# Patient Record
Sex: Female | Born: 1965 | Race: Black or African American | Hispanic: No | Marital: Married | Smoking: Never smoker
Health system: Southern US, Community
[De-identification: ages and names within clinical notes are randomized; demographics above are authoritative.]

## PROBLEM LIST (undated history)

## (undated) DIAGNOSIS — E785 Hyperlipidemia, unspecified: Secondary | ICD-10-CM

## (undated) DIAGNOSIS — I729 Aneurysm of unspecified site: Secondary | ICD-10-CM

## (undated) HISTORY — DX: Hyperlipidemia, unspecified: E78.5

## (undated) HISTORY — PX: BREAST EXCISIONAL BIOPSY: SUR124

---

## 1998-05-14 ENCOUNTER — Inpatient Hospital Stay (HOSPITAL_COMMUNITY): Admission: AD | Admit: 1998-05-14 | Discharge: 1998-05-14 | Payer: Self-pay | Admitting: *Deleted

## 1999-03-30 ENCOUNTER — Encounter: Payer: Self-pay | Admitting: Obstetrics and Gynecology

## 1999-03-30 ENCOUNTER — Inpatient Hospital Stay (HOSPITAL_COMMUNITY): Admission: AD | Admit: 1999-03-30 | Discharge: 1999-04-03 | Payer: Self-pay | Admitting: Obstetrics and Gynecology

## 1999-04-01 ENCOUNTER — Encounter: Payer: Self-pay | Admitting: Obstetrics and Gynecology

## 1999-04-03 ENCOUNTER — Encounter: Payer: Self-pay | Admitting: Obstetrics and Gynecology

## 1999-04-06 ENCOUNTER — Inpatient Hospital Stay (HOSPITAL_COMMUNITY): Admission: AD | Admit: 1999-04-06 | Discharge: 1999-04-08 | Payer: Self-pay | Admitting: Obstetrics and Gynecology

## 1999-04-06 ENCOUNTER — Encounter (INDEPENDENT_AMBULATORY_CARE_PROVIDER_SITE_OTHER): Payer: Self-pay | Admitting: Specialist

## 1999-04-15 ENCOUNTER — Encounter: Admission: RE | Admit: 1999-04-15 | Discharge: 1999-07-14 | Payer: Self-pay | Admitting: Obstetrics and Gynecology

## 2000-12-14 ENCOUNTER — Other Ambulatory Visit: Admission: RE | Admit: 2000-12-14 | Discharge: 2000-12-14 | Payer: Self-pay | Admitting: Obstetrics and Gynecology

## 2002-02-11 ENCOUNTER — Other Ambulatory Visit: Admission: RE | Admit: 2002-02-11 | Discharge: 2002-02-11 | Payer: Self-pay | Admitting: Obstetrics and Gynecology

## 2002-04-07 ENCOUNTER — Encounter: Payer: Self-pay | Admitting: Obstetrics and Gynecology

## 2002-04-07 ENCOUNTER — Ambulatory Visit (HOSPITAL_COMMUNITY): Admission: RE | Admit: 2002-04-07 | Discharge: 2002-04-07 | Payer: Self-pay | Admitting: Obstetrics and Gynecology

## 2002-08-24 ENCOUNTER — Inpatient Hospital Stay (HOSPITAL_COMMUNITY): Admission: AD | Admit: 2002-08-24 | Discharge: 2002-08-26 | Payer: Self-pay | Admitting: Obstetrics and Gynecology

## 2004-01-09 ENCOUNTER — Other Ambulatory Visit: Admission: RE | Admit: 2004-01-09 | Discharge: 2004-01-09 | Payer: Self-pay | Admitting: Obstetrics and Gynecology

## 2005-06-09 ENCOUNTER — Other Ambulatory Visit: Admission: RE | Admit: 2005-06-09 | Discharge: 2005-06-09 | Payer: Self-pay | Admitting: Obstetrics and Gynecology

## 2006-01-16 ENCOUNTER — Ambulatory Visit: Payer: Self-pay | Admitting: Family Medicine

## 2006-04-06 ENCOUNTER — Ambulatory Visit: Payer: Self-pay | Admitting: Family Medicine

## 2007-02-18 ENCOUNTER — Encounter: Admission: RE | Admit: 2007-02-18 | Discharge: 2007-02-18 | Payer: Self-pay | Admitting: Obstetrics and Gynecology

## 2007-08-18 ENCOUNTER — Ambulatory Visit: Payer: Self-pay | Admitting: Family Medicine

## 2008-02-21 ENCOUNTER — Encounter: Admission: RE | Admit: 2008-02-21 | Discharge: 2008-02-21 | Payer: Self-pay | Admitting: Obstetrics and Gynecology

## 2008-11-03 ENCOUNTER — Inpatient Hospital Stay (HOSPITAL_COMMUNITY): Admission: AD | Admit: 2008-11-03 | Discharge: 2008-11-03 | Payer: Self-pay | Admitting: Obstetrics and Gynecology

## 2009-05-13 ENCOUNTER — Inpatient Hospital Stay (HOSPITAL_COMMUNITY): Admission: AD | Admit: 2009-05-13 | Discharge: 2009-05-15 | Payer: Self-pay | Admitting: Obstetrics and Gynecology

## 2009-05-14 ENCOUNTER — Encounter (INDEPENDENT_AMBULATORY_CARE_PROVIDER_SITE_OTHER): Payer: Self-pay | Admitting: Obstetrics and Gynecology

## 2009-08-14 ENCOUNTER — Ambulatory Visit: Payer: Self-pay | Admitting: Family Medicine

## 2009-09-28 ENCOUNTER — Ambulatory Visit: Payer: Self-pay | Admitting: Family Medicine

## 2009-12-27 ENCOUNTER — Encounter: Admission: RE | Admit: 2009-12-27 | Discharge: 2009-12-27 | Payer: Self-pay | Admitting: Obstetrics and Gynecology

## 2010-10-02 LAB — CBC
HCT: 37.3 % (ref 36.0–46.0)
Hemoglobin: 12.4 g/dL (ref 12.0–15.0)
Hemoglobin: 14.1 g/dL (ref 12.0–15.0)
MCHC: 33.3 g/dL (ref 30.0–36.0)
MCV: 93.7 fL (ref 78.0–100.0)
MCV: 94.1 fL (ref 78.0–100.0)
Platelets: 230 10*3/uL (ref 150–400)
RBC: 4.52 MIL/uL (ref 3.87–5.11)
RDW: 14.3 % (ref 11.5–15.5)
WBC: 12.2 10*3/uL — ABNORMAL HIGH (ref 4.0–10.5)
WBC: 15.5 10*3/uL — ABNORMAL HIGH (ref 4.0–10.5)

## 2010-11-15 NOTE — H&P (Signed)
NAME:  Misty Clarke, Misty Clarke                         ACCOUNT NO.:  [id number]   MEDICAL RECORD NO.:  [id number]                   PATIENT TYPE:  INP   LOCATION:  9171                                 FACILITY:  [facility]   PHYSICIAN:  Naima A. Dillard, M.D.              DATE OF BIRTH:  [birth year]   DATE OF ADMISSION:  08/24/2002  DATE OF DISCHARGE:                                HISTORY & PHYSICAL   HISTORY OF PRESENT ILLNESS:  The patient is a 45 year old gravida 3, para 0-  1-1-1 at 72 weeks, EDD September 08, 2002 by dates confirmed by pregnancy  ultrasonography.  She presents in active labor at 6 cm dilated.  She reports  positive fetal movement, no bleeding, no rupture of membranes.  Denies any  headache, visual changes, or epigastric pain.  Her pregnancy has been  followed by the C.N.M. service at [facility] and is remarkable for preterm  premature rupture of membranes and preterm delivery at 32 weeks, history of  abnormal Pap smear and LEEP procedure, advanced maternal age, declines  amniocentesis, group B Strep positive with penicillin allergy.  This patient  was initially evaluated at the office of [facility] on February 11, 2002 at  approximately [redacted] weeks gestation.  EDC determined by dates and confirmed  with 13-week 3-day ultrasound.  The patient has subsequently had level II  ultrasound at 18 weeks which was size equal to dates, normal fluid, normal  female anatomy.  Her pregnancy has been essentially unremarkable.  She has  been size equal to dates throughout, normotensive with no proteinuria.   PRENATAL LABORATORY WORK:  On February 11, 2002 hemoglobin and hematocrit 14.0  and 42.3, platelets 259,000.  Blood type and Rh O+.  Antibody screen  negative.  Sickle cell trait negative.  VDRL nonreactive.  Rubella immune.  Hepatitis B surface antigen negative.  HIV nonreactive.  Pap smear showed  Candida.  GC and Chlamydia negative.  On March 24, 2002 AFP/free beta  hCG within normal range.  On June 16, 2002 at 28 weeks one-hour glucose  challenge 121 and hemoglobin 12.1.  At 36 weeks culture of the vaginal tract  is negative for GC and Chlamydia, positive for group B Strep.   OB HISTORY:  In 1994 patient had a first trimester SAB with no  complications.  In the year 2000 she had a normal spontaneous vaginal  delivery at 32 weeks for a 4 pound 1 ounce female infant named [place].  She  developed PTROM and delivered at 32 weeks with chorioamnionitis.  She had an  abnormal Pap smear in 1991 and LEEP procedure in 1992.   PAST SURGICAL HISTORY:  Wisdom teeth.   FAMILY HISTORY:  Paternal grandfather with a history of MI and heart  disease.  The patient's mother and maternal uncle with hypertension.  Mother  with anemia.  Father and paternal grandfather with diabetes.  The patient's  father with kidney disease and dialysis.  Paternal grandmother with lupus.  Maternal grandfather with colon CA.  The patient's father with a history of  schizophrenia.   GENETIC HISTORY:  The patient is 45 years old.  Otherwise, there is no  family history of familial or genetic disorders, children that died in  infancy or that were born with birth defects.   ALLERGIES:  The patient has an allergy to PENICILLIN which causes her joints  to swell.   SOCIAL HISTORY:  She denies the use of tobacco, alcohol, or illicit drugs.  The patient is a married African-American female.  Her husband, Bernita Buffy, is involved and supportive.  They are [facility] in their faith.   REVIEW OF SYSTEMS:  As described above.  The patient is typical of one with  a uterine pregnancy at term in active labor.   PHYSICAL EXAMINATION:  VITAL SIGNS:  Stable, afebrile.  HEENT:  Unremarkable.  HEART:  Regular rate and rhythm.  LUNGS:  Clear.  ABDOMEN:  Gravid in its contour.  Uterine fundus is noted to extend 38 cm  above the level of the pubic symphysis.  Leopold's maneuver finds the infant  to be in a longitudinal lie, cephalic  presentation and the estimated fetal  weight is 6.5 pounds.  The fetal heart rate baseline is 130s with positive  variability, positive accelerations, and is reactive at present with mild  variable decelerations noted.  On admission variability was good with mild  to moderate variables.  This responded well to O2 10 L facemask, IV fluids,  and maternal position change.  The patient is contracting every two to three  minutes.  PELVIC:  Digital examination of her cervix finds it to be 6 cm dilated,  completely effaced with the cephalic presenting part at a -1 station and  membranes intact.   ASSESSMENT:  1. Intrauterine pregnancy at term.  2. Active labor.   PLAN:  Admit per Naima A. Normand Sloop, M.D.  Clindamycin 900 mg Iv q.8h. for  positive group B Strep.  Anticipate spontaneous vaginal delivery.     Rica Koyanagi, C.N.M.               Naima A. Normand Sloop, M.D.    SDM/MEDQ  D:  08/24/2002  T:  08/24/2002  Job:  [id number]

## 2010-12-16 ENCOUNTER — Other Ambulatory Visit: Payer: Self-pay | Admitting: Obstetrics and Gynecology

## 2010-12-16 DIAGNOSIS — Z1231 Encounter for screening mammogram for malignant neoplasm of breast: Secondary | ICD-10-CM

## 2010-12-30 ENCOUNTER — Ambulatory Visit
Admission: RE | Admit: 2010-12-30 | Discharge: 2010-12-30 | Disposition: A | Discharge status: Home / Self Care | Payer: Private Health Insurance - Indemnity | Source: Ambulatory Visit | Attending: Obstetrics and Gynecology | Admitting: Obstetrics and Gynecology

## 2010-12-30 DIAGNOSIS — Z1231 Encounter for screening mammogram for malignant neoplasm of breast: Secondary | ICD-10-CM

## 2011-02-24 ENCOUNTER — Encounter: Payer: Self-pay | Admitting: Family Medicine

## 2011-07-23 ENCOUNTER — Ambulatory Visit (INDEPENDENT_AMBULATORY_CARE_PROVIDER_SITE_OTHER): Payer: Private Health Insurance - Indemnity | Admitting: Family Medicine

## 2011-07-23 ENCOUNTER — Encounter: Payer: Self-pay | Admitting: Family Medicine

## 2011-07-23 VITALS — BP 120/70 | HR 84 | Ht 67.0 in | Wt 176.0 lb

## 2011-07-23 DIAGNOSIS — Z0000 Encounter for general adult medical examination without abnormal findings: Secondary | ICD-10-CM

## 2011-07-23 DIAGNOSIS — J309 Allergic rhinitis, unspecified: Secondary | ICD-10-CM | POA: Insufficient documentation

## 2011-07-23 DIAGNOSIS — Z8249 Family history of ischemic heart disease and other diseases of the circulatory system: Secondary | ICD-10-CM

## 2011-07-23 DIAGNOSIS — Z23 Encounter for immunization: Secondary | ICD-10-CM

## 2011-07-23 NOTE — Patient Instructions (Signed)
Work on increasing your physical activity. Also make some dietary changes especially in regard to carbohydrates (white foods!)

## 2011-07-23 NOTE — Progress Notes (Signed)
  Subjective:    Patient ID: Misty Clarke, female    DOB: Mar 20, 1966, 46 y.o.   MRN: [id number]  HPI She is here for complete examination. Her medical record was reviewed. She is up-to-date on her immunizations, mammogram and Pap. She is not interested in a flu shot. Her allergies are under good control. Review of her family history shows father dying at age 94 of an MI. Her work is going well. She has 3 children. The marriage is going well. She has no other concerns.   Review of Systems  Constitutional: Negative.   HENT: Negative.   Eyes: Negative.   Respiratory: Negative.   Cardiovascular: Negative.   Gastrointestinal: Negative.   Musculoskeletal: Negative.   Neurological: Negative.   Hematological: Negative.   Psychiatric/Behavioral: Negative.        Objective:   Physical Exam BP 120/70  Pulse 84  Ht 5\' 7"  (1.702 m)  Wt 176 lb (79.833 kg)  BMI 27.57 kg/m2  LMP 06/27/2011  General Appearance:    Alert, cooperative, no distress, appears stated age  Head:    Normocephalic, without obvious abnormality, atraumatic  Eyes:    PERRL, conjunctiva/corneas clear, EOM's intact, fundi    benign  Ears:    Normal TM's and external ear canals  Nose:   Nares normal, mucosa normal, no drainage or sinus   tenderness  Throat:   Lips, mucosa, and tongue normal; teeth and gums normal  Neck:   Supple, no lymphadenopathy;  thyroid:  no   enlargement/tenderness/nodules; no carotid   bruit or JVD  Back:    Spine nontender, no curvature, ROM normal, no CVA     tenderness  Lungs:     Clear to auscultation bilaterally without wheezes, rales or     ronchi; respirations unlabored  Chest Wall:    No tenderness or deformity   Heart:    Regular rate and rhythm, S1 and S2 normal, no murmur, rub   or gallop  Breast Exam:    Deferred to GYN  Abdomen:     Soft, non-tender, nondistended, normoactive bowel sounds,    no masses, no hepatosplenomegaly  Genitalia:    Deferred to GYN     Extremities:   No  clubbing, cyanosis or edema  Pulses:   2+ and symmetric all extremities  Skin:   Skin color, texture, turgor normal, no rashes or lesions  Lymph nodes:   Cervical, supraclavicular, and axillary nodes normal  Neurologic:   CNII-XII intact, normal strength, sensation and gait; reflexes 2+ and symmetric throughout          Psych:   Normal mood, affect, hygiene and grooming.          Assessment & Plan:   1. Routine general medical examination at a health care facility  Flu vaccine greater than or equal to 3yo preservative free IM, CBC with Differential, Comprehensive metabolic panel, Lipid panel  2. Family history of heart disease in female family member before age 23  Lipid panel  3. Allergic rhinitis, mild     talk to her at length concerning lifestyle modification in regard to diet and exercise. Her BMI puts her in the overweight category. We also discussed her children and discussed childbearing techniques especially in regards to respect, response and resourceful.

## 2011-07-24 LAB — COMPREHENSIVE METABOLIC PANEL
ALT: 17 U/L (ref 0–35)
AST: 19 U/L (ref 0–37)
Albumin: 4.7 g/dL (ref 3.5–5.2)
CO2: 25 mEq/L (ref 19–32)
Calcium: 9.1 mg/dL (ref 8.4–10.5)
Sodium: 138 mEq/L (ref 135–145)
Total Protein: 7.2 g/dL (ref 6.0–8.3)

## 2011-07-24 LAB — CBC WITH DIFFERENTIAL/PLATELET
Basophils Relative: 1 % (ref 0–1)
Eosinophils Absolute: 0.1 10*3/uL (ref 0.0–0.7)
Eosinophils Relative: 2 % (ref 0–5)
Hemoglobin: 13.2 g/dL (ref 12.0–15.0)
Lymphs Abs: 2.2 10*3/uL (ref 0.7–4.0)
MCH: 29.3 pg (ref 26.0–34.0)
MCHC: 32.1 g/dL (ref 30.0–36.0)
MCV: 91.1 fL (ref 78.0–100.0)
Monocytes Relative: 6 % (ref 3–12)
Neutrophils Relative %: 57 % (ref 43–77)

## 2011-07-24 NOTE — Progress Notes (Signed)
Quick Note:  The blood work is normal ______ 

## 2011-12-01 ENCOUNTER — Ambulatory Visit (INDEPENDENT_AMBULATORY_CARE_PROVIDER_SITE_OTHER): Payer: Private Health Insurance - Indemnity | Admitting: Family Medicine

## 2011-12-01 VITALS — BP 114/70 | HR 87 | Wt 170.0 lb

## 2011-12-01 DIAGNOSIS — H00019 Hordeolum externum unspecified eye, unspecified eyelid: Secondary | ICD-10-CM

## 2011-12-01 DIAGNOSIS — H00016 Hordeolum externum left eye, unspecified eyelid: Secondary | ICD-10-CM

## 2011-12-01 MED ORDER — ERYTHROMYCIN 5 MG/GM OP OINT: 1.0000 "application " | TOPICAL_OINTMENT | Freq: Four times a day (QID) | OPHTHALMIC | Status: DC

## 2011-12-01 NOTE — Patient Instructions (Signed)
Use heat for 20 minutes 3 times a day as well as the ophthalmic ointment. If this does not go away in several days, give me a call

## 2011-12-01 NOTE — Progress Notes (Signed)
  Subjective:    Patient ID: Misty Clarke, female    DOB: 26-Sep-1965, 46 y.o.   MRN: [id number]  HPI She has a several day history of swelling and redness to the left upper eyelid.   Review of Systems     Objective:   Physical Exam Left upper eyelid is slightly red and swollen but no palpable lesions were present. Conjunctiva and cornea normal.       Assessment & Plan:   1. Stye, left    erythromycin ointment, heat. If continued difficulty, ophthalmology referral will be made.

## 2012-01-13 ENCOUNTER — Other Ambulatory Visit: Payer: Self-pay | Admitting: Obstetrics and Gynecology

## 2012-01-13 DIAGNOSIS — Z1231 Encounter for screening mammogram for malignant neoplasm of breast: Secondary | ICD-10-CM

## 2012-01-26 ENCOUNTER — Ambulatory Visit
Admission: RE | Admit: 2012-01-26 | Discharge: 2012-01-26 | Disposition: A | Discharge status: Home / Self Care | Payer: Private Health Insurance - Indemnity | Source: Ambulatory Visit | Attending: Obstetrics and Gynecology | Admitting: Obstetrics and Gynecology

## 2012-01-26 DIAGNOSIS — Z1231 Encounter for screening mammogram for malignant neoplasm of breast: Secondary | ICD-10-CM

## 2012-01-29 ENCOUNTER — Encounter: Payer: Self-pay | Admitting: Obstetrics and Gynecology

## 2012-03-04 ENCOUNTER — Ambulatory Visit (INDEPENDENT_AMBULATORY_CARE_PROVIDER_SITE_OTHER): Payer: Private Health Insurance - Indemnity | Admitting: Medical

## 2012-03-04 ENCOUNTER — Encounter: Payer: Self-pay | Admitting: Medical

## 2012-03-04 VITALS — BP 120/80 | HR 76 | Temp 98.3°F | Resp 16 | Wt 173.0 lb

## 2012-03-04 DIAGNOSIS — H6123 Impacted cerumen, bilateral: Secondary | ICD-10-CM

## 2012-03-04 DIAGNOSIS — H9211 Otorrhea, right ear: Secondary | ICD-10-CM

## 2012-03-04 DIAGNOSIS — H9210 Otorrhea, unspecified ear: Secondary | ICD-10-CM

## 2012-03-04 DIAGNOSIS — H6120 Impacted cerumen, unspecified ear: Secondary | ICD-10-CM

## 2012-03-04 DIAGNOSIS — H00019 Hordeolum externum unspecified eye, unspecified eyelid: Secondary | ICD-10-CM

## 2012-03-04 MED ORDER — ERYTHROMYCIN 5 MG/GM OP OINT: TOPICAL_OINTMENT | Freq: Four times a day (QID) | OPHTHALMIC | Status: AC

## 2012-03-04 NOTE — Progress Notes (Signed)
Subjective: Here for trouble with her ears.  Feels like water caught in the ears like when swimming.  Been having some clear drainage of the right ear x 3 days.  Denies ear pain.  She denies pain, cough, fever, runny nose, sore throat, feeling ill, no NVD.   She also has swelling of right right x few days.  Using nothing for either symptoms.  No other c/o.   No past medical history on file.  ROS as noted above in HPI  Objective: Gen: wd, wn, nad HENT: head nontender, both ear canals with impacted cerumen, but no erythema, drainage, swelling or tenderness.  Nares patent, no swelling, no erythema.  Pharynx normal.   Eyes: right eyelid superiorly with round 4mm swelling and white slight discharge at eyelid, suggestive of stye that is starting to drain.  otherwise conjunctiva pink, no swelling or erythema Neck: supple, no mass, nontender   Assessment: Encounter Diagnoses  Name Primary?  . Ear drainage right Yes  . Impacted cerumen of both ears   . Stye     Plan: With her consent, flushed out cerumen from both ears with success.   No erythema or swelling suggestive of infection.  She noted improvement in right side hearing. The TMs are flat but no obvious fluid, erythema or infection.  Advised that drainage was most likely from cerumen.  At this point no further treatment for the ear c/o.  Advised she begin warm compresses and Emycin ointment for the right stye.  F/u prn if not improving or worse.

## 2012-08-24 ENCOUNTER — Encounter: Payer: Self-pay | Admitting: Internal Medicine

## 2012-08-30 ENCOUNTER — Encounter: Payer: Private Health Insurance - Indemnity | Admitting: Family Medicine

## 2013-03-11 ENCOUNTER — Other Ambulatory Visit: Payer: Self-pay

## 2013-03-11 DIAGNOSIS — Z1231 Encounter for screening mammogram for malignant neoplasm of breast: Secondary | ICD-10-CM

## 2013-04-05 ENCOUNTER — Ambulatory Visit
Admission: RE | Admit: 2013-04-05 | Discharge: 2013-04-05 | Disposition: A | Discharge status: Home / Self Care | Payer: Private Health Insurance - Indemnity | Source: Ambulatory Visit

## 2013-04-05 DIAGNOSIS — Z1231 Encounter for screening mammogram for malignant neoplasm of breast: Secondary | ICD-10-CM

## 2014-03-03 ENCOUNTER — Other Ambulatory Visit: Payer: Self-pay

## 2014-03-03 DIAGNOSIS — Z1231 Encounter for screening mammogram for malignant neoplasm of breast: Secondary | ICD-10-CM

## 2014-04-06 ENCOUNTER — Ambulatory Visit
Admission: RE | Admit: 2014-04-06 | Discharge: 2014-04-06 | Disposition: A | Discharge status: Home / Self Care | Payer: Managed Care, Other (non HMO) | Source: Ambulatory Visit

## 2014-04-06 DIAGNOSIS — Z1231 Encounter for screening mammogram for malignant neoplasm of breast: Secondary | ICD-10-CM

## 2015-03-06 ENCOUNTER — Other Ambulatory Visit: Payer: Self-pay

## 2015-03-06 DIAGNOSIS — Z1231 Encounter for screening mammogram for malignant neoplasm of breast: Secondary | ICD-10-CM

## 2015-04-23 ENCOUNTER — Ambulatory Visit
Admission: RE | Admit: 2015-04-23 | Discharge: 2015-04-23 | Disposition: A | Discharge status: Home / Self Care | Payer: BLUE CROSS/BLUE SHIELD | Source: Ambulatory Visit

## 2015-04-23 DIAGNOSIS — Z1231 Encounter for screening mammogram for malignant neoplasm of breast: Secondary | ICD-10-CM

## 2015-11-21 DIAGNOSIS — Z01419 Encounter for gynecological examination (general) (routine) without abnormal findings: Secondary | ICD-10-CM | POA: Diagnosis not present

## 2015-11-21 DIAGNOSIS — Z6826 Body mass index (BMI) 26.0-26.9, adult: Secondary | ICD-10-CM | POA: Diagnosis not present

## 2015-11-21 DIAGNOSIS — Z124 Encounter for screening for malignant neoplasm of cervix: Secondary | ICD-10-CM | POA: Diagnosis not present

## 2016-04-03 ENCOUNTER — Other Ambulatory Visit: Payer: Self-pay | Admitting: Obstetrics and Gynecology

## 2016-04-03 DIAGNOSIS — Z1231 Encounter for screening mammogram for malignant neoplasm of breast: Secondary | ICD-10-CM

## 2016-05-07 ENCOUNTER — Ambulatory Visit
Admission: RE | Admit: 2016-05-07 | Discharge: 2016-05-07 | Disposition: A | Discharge status: Home / Self Care | Payer: BLUE CROSS/BLUE SHIELD | Source: Ambulatory Visit | Attending: Obstetrics and Gynecology | Admitting: Obstetrics and Gynecology

## 2016-05-07 DIAGNOSIS — Z1231 Encounter for screening mammogram for malignant neoplasm of breast: Secondary | ICD-10-CM | POA: Diagnosis not present

## 2016-07-15 DIAGNOSIS — H524 Presbyopia: Secondary | ICD-10-CM | POA: Diagnosis not present

## 2016-07-15 DIAGNOSIS — H5213 Myopia, bilateral: Secondary | ICD-10-CM | POA: Diagnosis not present

## 2016-08-14 DIAGNOSIS — Z13220 Encounter for screening for lipoid disorders: Secondary | ICD-10-CM | POA: Diagnosis not present

## 2016-09-18 ENCOUNTER — Ambulatory Visit
Admission: RE | Admit: 2016-09-18 | Discharge: 2016-09-18 | Disposition: A | Discharge status: Home / Self Care | Payer: BLUE CROSS/BLUE SHIELD | Source: Ambulatory Visit | Attending: Family Medicine | Admitting: Family Medicine

## 2016-09-18 ENCOUNTER — Ambulatory Visit (INDEPENDENT_AMBULATORY_CARE_PROVIDER_SITE_OTHER): Payer: BLUE CROSS/BLUE SHIELD | Admitting: Family Medicine

## 2016-09-18 ENCOUNTER — Encounter: Payer: Self-pay | Admitting: Family Medicine

## 2016-09-18 VITALS — BP 120/80 | Ht 67.0 in | Wt 171.0 lb

## 2016-09-18 DIAGNOSIS — M25462 Effusion, left knee: Secondary | ICD-10-CM

## 2016-09-18 NOTE — Patient Instructions (Signed)
You can take 800 mg of ibuprofen 3 times per day

## 2016-09-18 NOTE — Progress Notes (Addendum)
   Subjective:    Patient ID: Misty Clarke, female    DOB: 1966-02-05, 51 y.o.   MRN: [id number]  HPI Planes of a three-week history of soreness and stiffness as well as tenderness medially to her left knee. She has had no injuries. No other joints are involved. No popping, locking or grinding. He has been using ibuprofen but not in maximum dosing.   Review of Systems     Objective:   Physical Exam Left knee exam does show a moderate effusion. It is not warm or tender. Anterior cruciate negative. Medial and lateral lateral ligaments are intact. McMurray's testing negative. No tenderness over joint line or patella.       Assessment & Plan:  Effusion of left knee joint - Plan: DG Knee Complete 4 Views Left Recommend that she continue with 800 mg of ibuprofen 3 times per day. If the x-ray is negative and she does not respond to the ibuprofen, I will reevaluate, draw fluid off. Discussed the possibility of this being pseudogout. xray was negative.

## 2016-10-06 ENCOUNTER — Ambulatory Visit (INDEPENDENT_AMBULATORY_CARE_PROVIDER_SITE_OTHER): Payer: BLUE CROSS/BLUE SHIELD | Admitting: Family Medicine

## 2016-10-06 ENCOUNTER — Encounter: Payer: Self-pay | Admitting: Family Medicine

## 2016-10-06 VITALS — BP 114/72 | HR 85 | Wt 172.0 lb

## 2016-10-06 DIAGNOSIS — M25462 Effusion, left knee: Secondary | ICD-10-CM | POA: Diagnosis not present

## 2016-10-06 MED ORDER — TRIAMCINOLONE ACETONIDE 40 MG/ML IJ SUSP
40.0000 mg | Freq: Once | INTRAMUSCULAR | Status: AC
  Administered 2016-10-06: 40 mg via INTRAMUSCULAR

## 2016-10-06 MED ORDER — LIDOCAINE HCL 2 % IJ SOLN
3.0000 mL | Freq: Once | INTRAMUSCULAR | Status: AC
  Administered 2016-10-06: 60 mg via INTRADERMAL

## 2016-10-06 NOTE — Progress Notes (Signed)
   Subjective:    Patient ID: Misty Clarke, female    DOB: [birth year], 51 y.o.   MRN: [id number]  HPI He continues have difficulty with left knee effusion and a tight feeling in that area but no popping, locking or grinding.   Review of Systems     Objective:   Physical Exam Left knee exam does show a small effusion. Is not hot or tender to palpation. Negative anterior drawer. Medial ankle lateral collateral ligaments are intact. Negative McMurray's testing.       Assessment & Plan:  Effusion of left knee joint - Plan: Synovial fluid, crystal, Synovial fluid, cell count, lidocaine (XYLOCAINE) 2 % (with pres) injection 60 mg, triamcinolone acetonide (KENALOG-40) injection 40 mg For discussion with her, decided to tap the joint. 10 mL of yellow clear fluid was removed. 40 mg of Kenalog and 3 mL of Xylocaine was injected into the joint without difficulty. She tolerated the procedure well.

## 2016-10-07 LAB — SYNOVIAL FLUID, CRYSTAL

## 2016-12-02 DIAGNOSIS — Z6826 Body mass index (BMI) 26.0-26.9, adult: Secondary | ICD-10-CM | POA: Diagnosis not present

## 2016-12-02 DIAGNOSIS — Z124 Encounter for screening for malignant neoplasm of cervix: Secondary | ICD-10-CM | POA: Diagnosis not present

## 2016-12-02 DIAGNOSIS — R87610 Atypical squamous cells of undetermined significance on cytologic smear of cervix (ASC-US): Secondary | ICD-10-CM | POA: Diagnosis not present

## 2016-12-02 DIAGNOSIS — Z01419 Encounter for gynecological examination (general) (routine) without abnormal findings: Secondary | ICD-10-CM | POA: Diagnosis not present

## 2017-03-06 DIAGNOSIS — H0011 Chalazion right upper eyelid: Secondary | ICD-10-CM | POA: Diagnosis not present

## 2017-04-20 ENCOUNTER — Other Ambulatory Visit: Payer: Self-pay | Admitting: Obstetrics and Gynecology

## 2017-04-20 DIAGNOSIS — Z1231 Encounter for screening mammogram for malignant neoplasm of breast: Secondary | ICD-10-CM

## 2017-05-11 ENCOUNTER — Ambulatory Visit
Admission: RE | Admit: 2017-05-11 | Discharge: 2017-05-11 | Disposition: A | Discharge status: Home / Self Care | Payer: BLUE CROSS/BLUE SHIELD | Source: Ambulatory Visit | Attending: Obstetrics and Gynecology | Admitting: Obstetrics and Gynecology

## 2017-05-11 DIAGNOSIS — Z1231 Encounter for screening mammogram for malignant neoplasm of breast: Secondary | ICD-10-CM

## 2017-07-21 DIAGNOSIS — H5213 Myopia, bilateral: Secondary | ICD-10-CM | POA: Diagnosis not present

## 2017-07-21 DIAGNOSIS — H524 Presbyopia: Secondary | ICD-10-CM | POA: Diagnosis not present

## 2017-07-21 DIAGNOSIS — H1789 Other corneal scars and opacities: Secondary | ICD-10-CM | POA: Diagnosis not present

## 2017-08-18 DIAGNOSIS — Z713 Dietary counseling and surveillance: Secondary | ICD-10-CM | POA: Diagnosis not present

## 2017-08-18 DIAGNOSIS — Z13220 Encounter for screening for lipoid disorders: Secondary | ICD-10-CM | POA: Diagnosis not present

## 2017-08-18 DIAGNOSIS — Z136 Encounter for screening for cardiovascular disorders: Secondary | ICD-10-CM | POA: Diagnosis not present

## 2017-12-02 IMAGING — MG DIGITAL SCREENING BILATERAL MAMMOGRAM WITH CAD
4 series · 4 of 4 positions shown · non-contrast
Comparison: Previous exam(s).

CLINICAL DATA: Screening.

EXAM:
DIGITAL SCREENING BILATERAL MAMMOGRAM WITH CAD

[L CC]
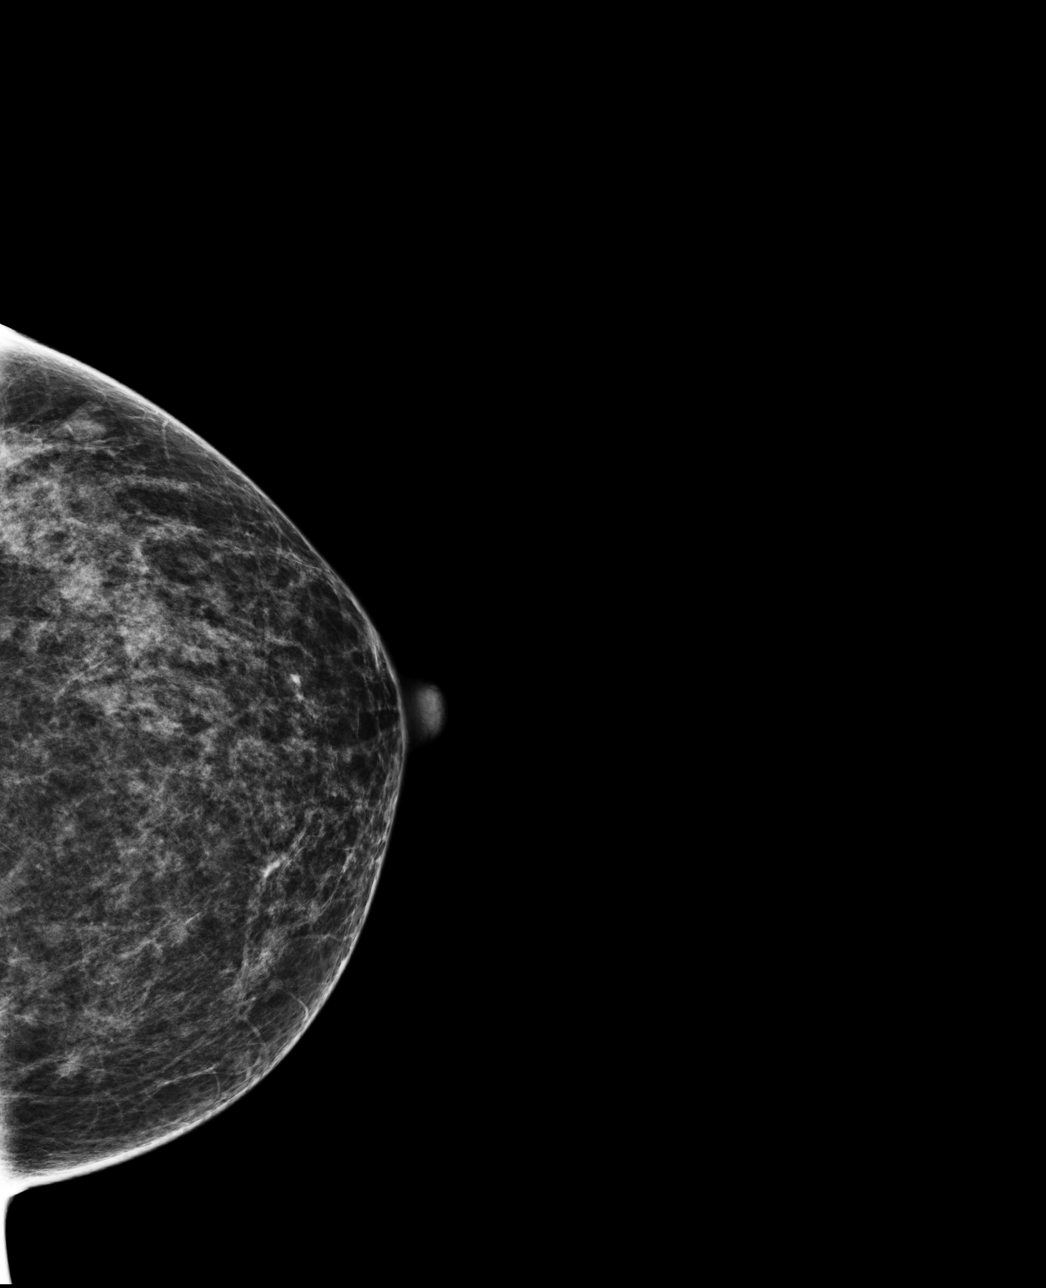

[R CC]
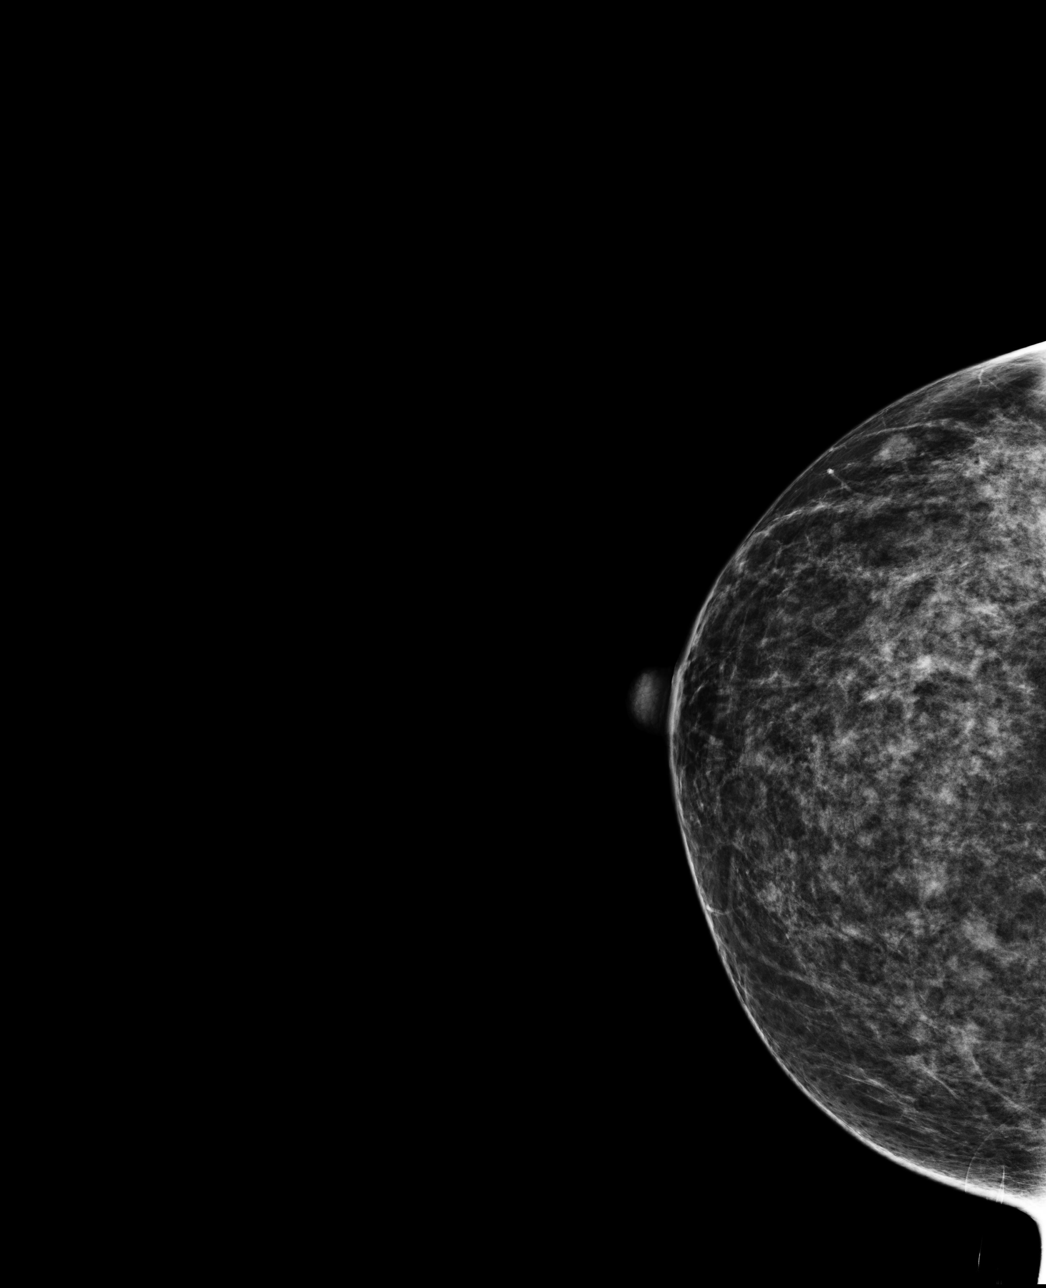

[R MLO]
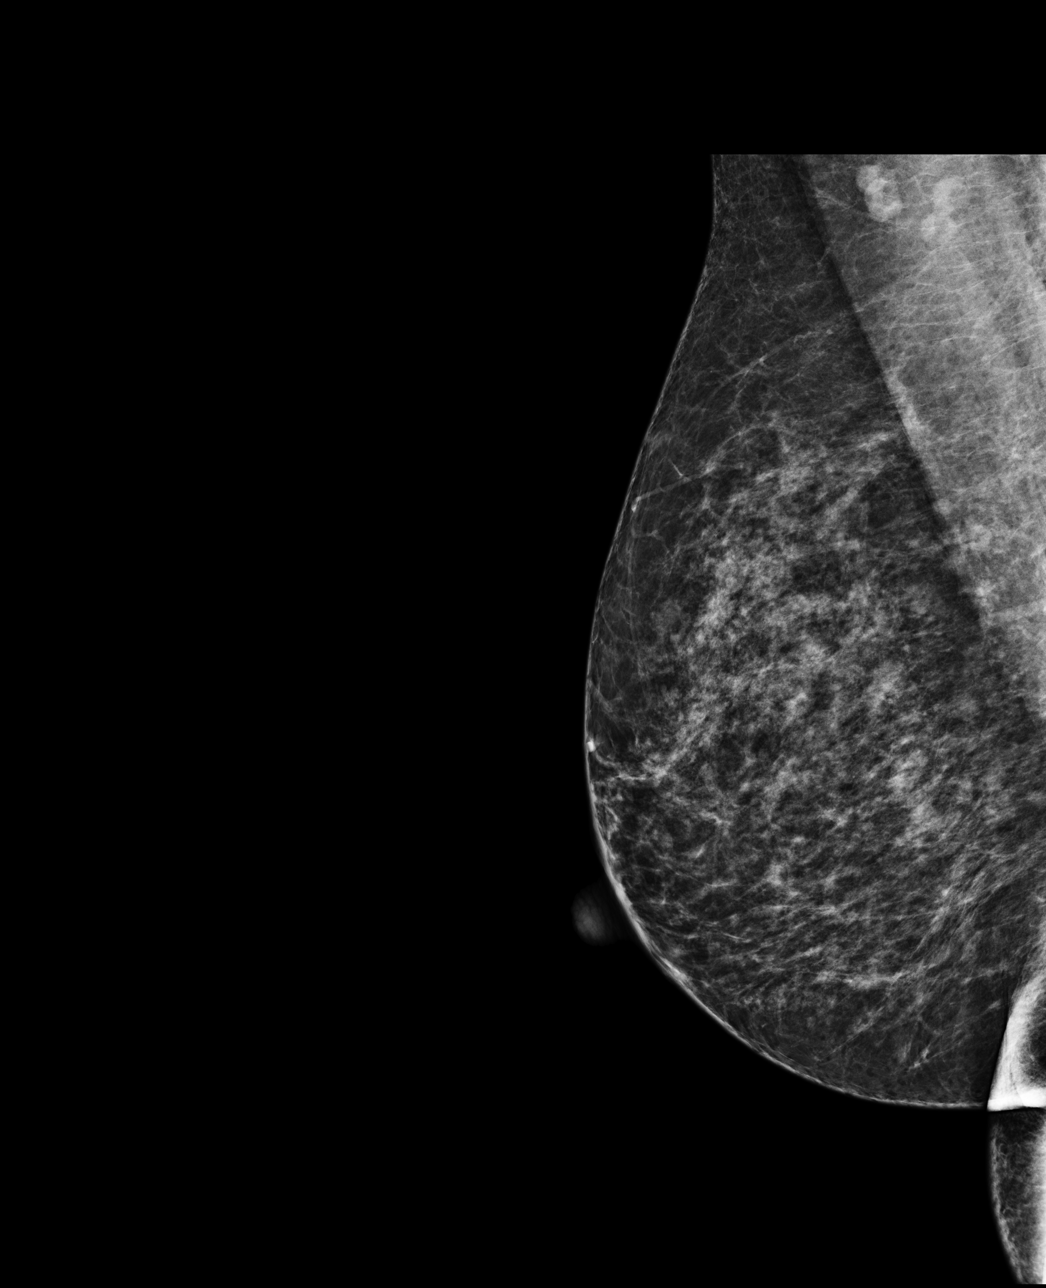

[L MLO]
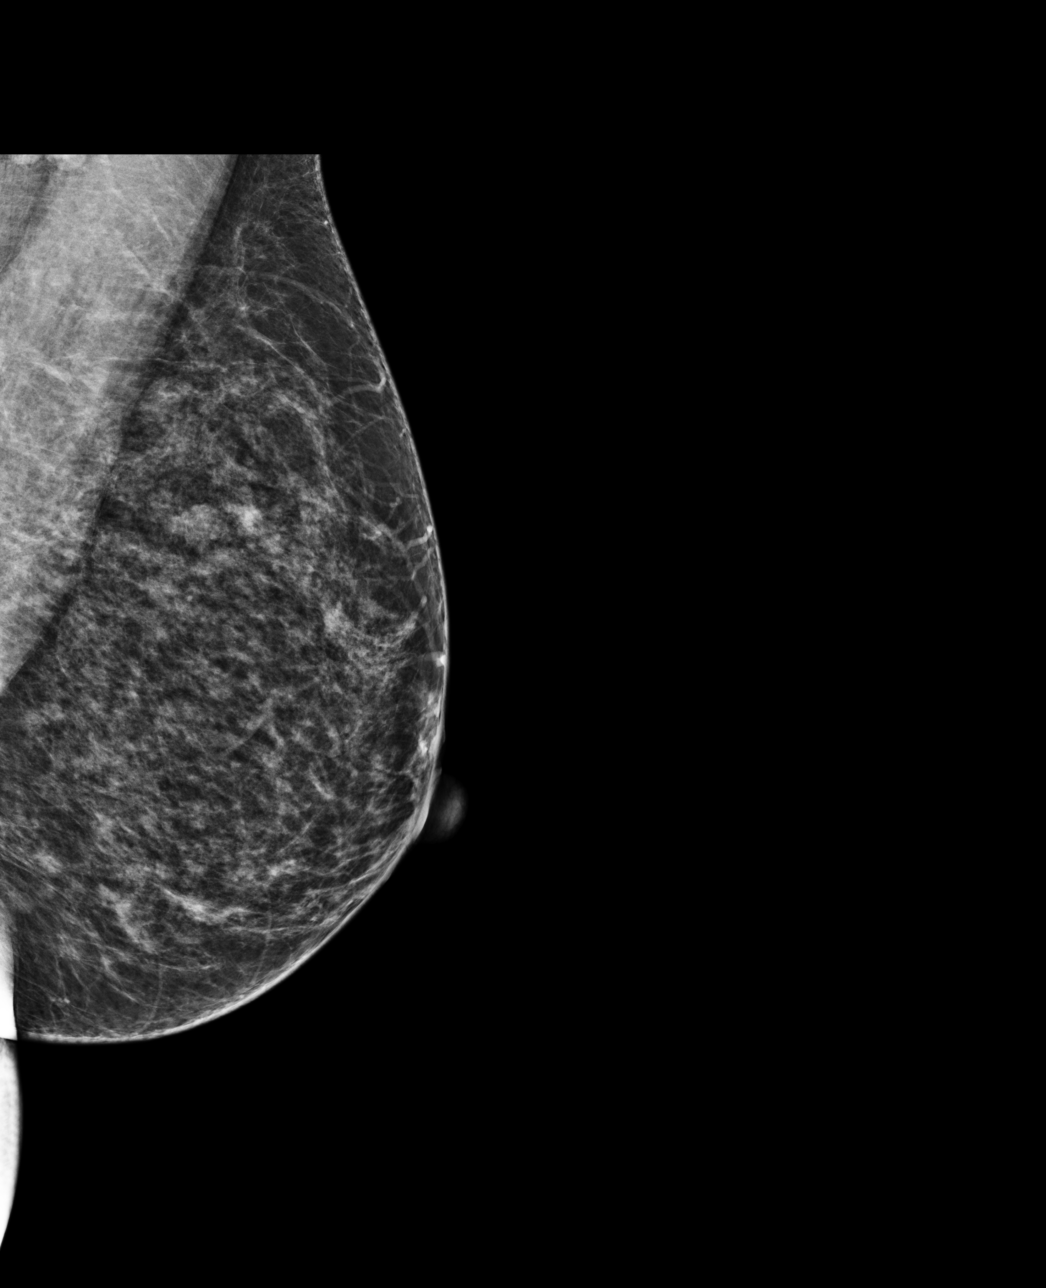

[4 of 4 positions shown; findings below may reference images not displayed]

ACR Breast Density Category c: The breast tissue is heterogeneously
dense, which may obscure small masses.
FINDINGS: There are no findings suspicious for malignancy. Images were
processed with CAD.
IMPRESSION: No mammographic evidence of malignancy. A result letter of this
screening mammogram will be mailed directly to the patient.

RECOMMENDATION:
Screening mammogram in one year. (Code:YJ-2-FEZ)

BI-RADS CATEGORY  1: Negative.

## 2017-12-07 DIAGNOSIS — Z6826 Body mass index (BMI) 26.0-26.9, adult: Secondary | ICD-10-CM | POA: Diagnosis not present

## 2017-12-07 DIAGNOSIS — Z01419 Encounter for gynecological examination (general) (routine) without abnormal findings: Secondary | ICD-10-CM | POA: Diagnosis not present

## 2017-12-07 DIAGNOSIS — Z124 Encounter for screening for malignant neoplasm of cervix: Secondary | ICD-10-CM | POA: Diagnosis not present

## 2018-02-17 ENCOUNTER — Telehealth: Payer: Self-pay | Admitting: Family Medicine

## 2018-02-17 NOTE — Telephone Encounter (Signed)
Pt called and states that she is due for a colonoscopy. If that could be set up pt can be reached at [phone]

## 2018-02-17 NOTE — Telephone Encounter (Signed)
Have her set up an appointment to discuss this and general medical issues.  Explain that she might qualify for a Cologuard however I have not seen her in over a year

## 2018-02-19 NOTE — Telephone Encounter (Signed)
Called pt to advise of setting up an appt for medical issues. No answer and lvm for her to call back  [facility]

## 2018-03-08 DIAGNOSIS — R21 Rash and other nonspecific skin eruption: Secondary | ICD-10-CM | POA: Diagnosis not present

## 2018-03-30 ENCOUNTER — Ambulatory Visit: Payer: BLUE CROSS/BLUE SHIELD | Admitting: Family Medicine

## 2018-03-30 ENCOUNTER — Encounter: Payer: Self-pay | Admitting: Family Medicine

## 2018-03-30 VITALS — BP 110/66 | HR 80 | Temp 97.8°F | Ht 68.0 in | Wt 174.0 lb

## 2018-03-30 DIAGNOSIS — T7840XA Allergy, unspecified, initial encounter: Secondary | ICD-10-CM | POA: Diagnosis not present

## 2018-03-30 DIAGNOSIS — R21 Rash and other nonspecific skin eruption: Secondary | ICD-10-CM

## 2018-03-30 MED ORDER — PREDNISONE 5 MG PO TABS: ORAL_TABLET | ORAL | 0 refills | Status: DC

## 2018-03-30 NOTE — Patient Instructions (Signed)
  Moisturize the two drier, itchier patches (forehead and neck). You may use a topical over-the-counter cortisone cream for itching, if needed. Restart the claritin and continue daily use. We are going to give you another short course of the low dose prednisone, given the involvement of the eyes. Continue to think about possible exposures. If ongoing issues, you may need to see an allergist vs doing blood testing with Dr. Redmond School.

## 2018-03-30 NOTE — Progress Notes (Signed)
Chief Complaint  Patient presents with  . Rash    itching around eyes, under chin and down neck. 3 weeks ago had a reaction to lip gloss, threw it away and thought it would get better. Then began to have spot on forehead. Went to [facility] for the itching. Once she stopped prednisone and stopped claritin, 2 days later symptoms returned.   . Flu Vaccine    declined.   She went to [facility] in [facility] 03/08/18 for facial rash.  She was prescribed prednisone taper (5mg , took 6/5/4/3/2/1).  She took claritin along with the prednisone, stopped them both together. Rash resolved, and a couple of days later the itching recurred.  She has been getting more bumps on the lips, spots on her eyelids, hairline, and a spot on her left forehead and on her neck. These spots are fixed, not coming and going like hives. +itchy. Same areas ,no new areas of involvement, no tongue/throat swelling or shortness of breath When itching started again, she restarted the claritin, helps just somewhat (wears off the when next dose was due).  At [facility], it was felt perhaps lip gloss was contributing. She threw this away.  Hasn't used any other new products, new foods, new exposures.  She checked with hair dresser, no change in products.  PMH, PSH, SH reviewed  No current outpatient medications on file prior to visit.   No current facility-administered medications on file prior to visit.    Allergies  Allergen Reactions  . Penicillins    ROS:  No fever, chills, cold symptoms.  She has had some mild allergies, nasal congestion. No cough, shortness of breath, nausea, vomiting, diarrhea. No other skin concerns, no bruising. No headaches, dizziness or other complaints. S/p BTL.  PHYSICAL EXAM:  BP 110/66   Pulse 80   Temp 97.8 F (36.6 C) (Tympanic)   Ht 5\' 8"  (1.727 m)   Wt 174 lb (78.9 kg)   LMP 03/06/2018   BMI 26.46 kg/m   Well-appearing, pleasant female, in good spirits, in no distress Face: conjunctiva and sclera are  clear, EOMI. OP is clear Skin: There is focal swelling of the left upper eyelid, small areas of focal swelling below both eyes, medially, which are raised.  There is a slightly hyperpigmented area on the left upper forehead, that is not raised, slightly rough/dry. The vermilion borders are raised (different from her norm, per pt) Dry, hyperpigmented oval patch on her anterior neck (darker and more noticeable, but similar to the one on her left forehead). Neck: no lymphadenopathy, thyromegaly or mass Heart: regular rate and rhythm Lungs: clear bilaterally Neuro: alert and oriented, cranial nerves intact   ASSESSMENT/PLAN:  Allergic reaction, initial encounter - Plan: predniSONE (DELTASONE) 5 MG tablet  Facial rash  Unclear etiology, suspect contact. Treat with another low prednisone course (low dose was effective in past) May need allergy eval if worsening Sees Dr. Redmond School for physical later this month.  Risks/SE reviewed.   Moisturize the two drier, itchier patches (forehead and neck). You may use a topical over-the-counter cortisone cream for itching, if needed. Restart the claritin and continue daily use. We are going to give you another short course of the low dose prednisone, given the involvement of the eyes. Continue to think about possible exposures. If ongoing issues, you may need to see an allergist vs doing blood testing with Dr. Redmond School.

## 2018-04-12 ENCOUNTER — Other Ambulatory Visit: Payer: Self-pay | Admitting: Obstetrics and Gynecology

## 2018-04-12 DIAGNOSIS — Z1231 Encounter for screening mammogram for malignant neoplasm of breast: Secondary | ICD-10-CM

## 2018-04-29 ENCOUNTER — Encounter: Payer: Self-pay | Admitting: Family Medicine

## 2018-04-29 ENCOUNTER — Ambulatory Visit: Payer: BLUE CROSS/BLUE SHIELD | Admitting: Family Medicine

## 2018-04-29 VITALS — BP 120/80 | HR 81 | Temp 97.6°F | Ht 67.0 in | Wt 173.2 lb

## 2018-04-29 DIAGNOSIS — Z8249 Family history of ischemic heart disease and other diseases of the circulatory system: Secondary | ICD-10-CM

## 2018-04-29 DIAGNOSIS — J309 Allergic rhinitis, unspecified: Secondary | ICD-10-CM

## 2018-04-29 DIAGNOSIS — Z1211 Encounter for screening for malignant neoplasm of colon: Secondary | ICD-10-CM

## 2018-04-29 DIAGNOSIS — Z23 Encounter for immunization: Secondary | ICD-10-CM | POA: Diagnosis not present

## 2018-04-29 DIAGNOSIS — Z0000 Encounter for general adult medical examination without abnormal findings: Secondary | ICD-10-CM | POA: Diagnosis not present

## 2018-04-29 LAB — POCT URINALYSIS DIP (PROADVANTAGE DEVICE)
Bilirubin, UA: NEGATIVE
GLUCOSE UA: NEGATIVE mg/dL
Ketones, POC UA: NEGATIVE mg/dL
NITRITE UA: NEGATIVE
PH UA: 6 (ref 5.0–8.0)
Protein Ur, POC: NEGATIVE mg/dL
RBC UA: NEGATIVE
SPECIFIC GRAVITY, URINE: 1.02
UUROB: 3.5

## 2018-04-29 NOTE — Progress Notes (Signed)
   Subjective:    Patient ID: Misty Clarke, female    DOB: 1965-10-24, 52 y.o.   MRN: [id number]  HPI She is here for complete examination.  She does have underlying allergies but they cause very little difficulty.  She also has a history of heart disease.  She has had no difficulty with chest pain, shortness breath, PND or DOE.  She does get regular gynecologic evaluation including mammogram.  There is no family history of colon cancer, polyps or other GI issues.  Her work and home life are going quite well.  She is not involved in a regular exercise program.  Family and social history as well as health maintenance and immunizations was reviewed.   Review of Systems  All other systems reviewed and are negative.      Objective:   Physical Exam BP 120/80 (BP Location: Left Arm, Patient Position: Sitting)   Pulse 81   Temp 97.6 F (36.4 C)   Ht 5\' 7"  (1.702 m)   Wt 173 lb 3.2 oz (78.6 kg)   SpO2 99%   BMI 27.13 kg/m   General Appearance:    Alert, cooperative, no distress, appears stated age  Head:    Normocephalic, without obvious abnormality, atraumatic  Eyes:    PERRL, conjunctiva/corneas clear, EOM's intact, fundi    benign  Ears:    Normal TM's and external ear canals  Nose:   Nares normal, mucosa normal, no drainage or sinus   tenderness  Throat:   Lips, mucosa, and tongue normal; teeth and gums normal  Neck:   Supple, no lymphadenopathy;  thyroid:  no   enlargement/tenderness/nodules; no carotid   bruit or JVD  Back:    Spine nontender, no curvature, ROM normal, no CVA     tenderness  Lungs:     Clear to auscultation bilaterally without wheezes, rales or     ronchi; respirations unlabored      Heart:    Regular rate and rhythm, S1 and S2 normal, no murmur, rub   or gallop  Breast Exam:    Deferred to GYN  Abdomen:     Soft, non-tender, nondistended, normoactive bowel sounds,    no masses, no hepatosplenomegaly  Genitalia:    Deferred to GYN     Extremities:   No  clubbing, cyanosis or edema  Pulses:   2+ and symmetric all extremities  Skin:   Skin color, texture, turgor normal, no rashes or lesions  Lymph nodes:   Cervical, supraclavicular, and axillary nodes normal  Neurologic:   CNII-XII intact, normal strength, sensation and gait; reflexes 2+ and symmetric throughout          Psych:   Normal mood, affect, hygiene and grooming.          Assessment & Plan:  Routine general medical examination at a health care facility - Plan: POCT Urinalysis DIP (Proadvantage Device)  Allergic rhinitis, mild  Family history of heart disease in female family member before age 40  Screening for colon cancer  Need for influenza vaccination  Need for Tdap vaccination I encouraged her to continue to take good care of herself.

## 2018-04-30 ENCOUNTER — Encounter: Payer: Self-pay | Admitting: Family Medicine

## 2018-04-30 LAB — COMPREHENSIVE METABOLIC PANEL
A/G RATIO: 1.7 (ref 1.2–2.2)
ALT: 21 IU/L (ref 0–32)
AST: 22 IU/L (ref 0–40)
Albumin: 4.6 g/dL (ref 3.5–5.5)
Alkaline Phosphatase: 67 IU/L (ref 39–117)
BILIRUBIN TOTAL: 0.8 mg/dL (ref 0.0–1.2)
BUN/Creatinine Ratio: 10 (ref 9–23)
BUN: 10 mg/dL (ref 6–24)
CALCIUM: 9.9 mg/dL (ref 8.7–10.2)
CHLORIDE: 103 mmol/L (ref 96–106)
CO2: 22 mmol/L (ref 20–29)
Creatinine, Ser: 1.05 mg/dL — ABNORMAL HIGH (ref 0.57–1.00)
GFR calc non Af Amer: 62 mL/min/{1.73_m2} (ref >59)
GFR, EST AFRICAN AMERICAN: 72 mL/min/{1.73_m2} (ref >59)
GLUCOSE: 75 mg/dL (ref 65–99)
Globulin, Total: 2.7 g/dL (ref 1.5–4.5)
POTASSIUM: 4.3 mmol/L (ref 3.5–5.2)
Sodium: 140 mmol/L (ref 134–144)
TOTAL PROTEIN: 7.3 g/dL (ref 6.0–8.5)

## 2018-04-30 LAB — CBC WITH DIFFERENTIAL/PLATELET
Basophils Absolute: 0.1 10*3/uL (ref 0.0–0.2)
Basos: 1 %
EOS (ABSOLUTE): 0.1 10*3/uL (ref 0.0–0.4)
Eos: 2 %
Hematocrit: 34 % (ref 34.0–46.6)
Hemoglobin: 11.1 g/dL (ref 11.1–15.9)
IMMATURE GRANS (ABS): 0 10*3/uL (ref 0.0–0.1)
IMMATURE GRANULOCYTES: 0 %
LYMPHS: 39 %
Lymphocytes Absolute: 2.6 10*3/uL (ref 0.7–3.1)
MCH: 28 pg (ref 26.6–33.0)
MCHC: 32.6 g/dL (ref 31.5–35.7)
MCV: 86 fL (ref 79–97)
MONOS ABS: 0.5 10*3/uL (ref 0.1–0.9)
Monocytes: 8 %
NEUTROS PCT: 50 %
Neutrophils Absolute: 3.4 10*3/uL (ref 1.4–7.0)
PLATELETS: 423 10*3/uL (ref 150–450)
RBC: 3.96 x10E6/uL (ref 3.77–5.28)
RDW: 13.4 % (ref 12.3–15.4)
WBC: 6.8 10*3/uL (ref 3.4–10.8)

## 2018-04-30 LAB — LIPID PANEL
Chol/HDL Ratio: 3 ratio (ref 0.0–4.4)
Cholesterol, Total: 223 mg/dL — ABNORMAL HIGH (ref 100–199)
HDL: 75 mg/dL (ref >39)
LDL Calculated: 134 mg/dL — ABNORMAL HIGH (ref 0–99)
Triglycerides: 71 mg/dL (ref 0–149)
VLDL CHOLESTEROL CAL: 14 mg/dL (ref 5–40)

## 2018-05-06 ENCOUNTER — Telehealth: Payer: Self-pay | Admitting: Family Medicine

## 2018-05-06 NOTE — Telephone Encounter (Signed)
error 

## 2018-05-14 ENCOUNTER — Ambulatory Visit
Admission: RE | Admit: 2018-05-14 | Discharge: 2018-05-14 | Disposition: A | Discharge status: Home / Self Care | Payer: BLUE CROSS/BLUE SHIELD | Source: Ambulatory Visit | Attending: Obstetrics and Gynecology | Admitting: Obstetrics and Gynecology

## 2018-05-14 DIAGNOSIS — Z1231 Encounter for screening mammogram for malignant neoplasm of breast: Secondary | ICD-10-CM | POA: Diagnosis not present

## 2018-05-17 DIAGNOSIS — L2089 Other atopic dermatitis: Secondary | ICD-10-CM | POA: Diagnosis not present

## 2018-05-27 DIAGNOSIS — Z1211 Encounter for screening for malignant neoplasm of colon: Secondary | ICD-10-CM | POA: Diagnosis not present

## 2018-06-01 LAB — COLOGUARD: COLOGUARD: NEGATIVE

## 2018-06-03 ENCOUNTER — Telehealth: Payer: Self-pay

## 2018-06-03 NOTE — Telephone Encounter (Signed)
lvm for pt negative cologuard results. [place]

## 2018-07-21 DIAGNOSIS — H524 Presbyopia: Secondary | ICD-10-CM | POA: Diagnosis not present

## 2018-07-21 DIAGNOSIS — H5213 Myopia, bilateral: Secondary | ICD-10-CM | POA: Diagnosis not present

## 2018-10-27 ENCOUNTER — Encounter: Payer: Self-pay | Admitting: Family Medicine

## 2018-10-27 ENCOUNTER — Other Ambulatory Visit: Payer: Self-pay

## 2018-10-27 ENCOUNTER — Ambulatory Visit: Payer: BLUE CROSS/BLUE SHIELD | Admitting: Family Medicine

## 2018-10-27 VITALS — Wt 174.0 lb

## 2018-10-27 DIAGNOSIS — R300 Dysuria: Secondary | ICD-10-CM | POA: Diagnosis not present

## 2018-10-27 DIAGNOSIS — R350 Frequency of micturition: Secondary | ICD-10-CM

## 2018-10-27 MED ORDER — NITROFURANTOIN MONOHYD MACRO 100 MG PO CAPS: 100.0000 mg | ORAL_CAPSULE | Freq: Two times a day (BID) | ORAL | 0 refills | Status: DC

## 2018-10-27 NOTE — Progress Notes (Signed)
   Subjective:   Documentation for virtual audio and video telecommunications through [place] encounter:  The patient was located at home. 2 patient identifiers.  The provider was located in the office. The patient did consent to this visit and is aware of possible charges through their insurance for this visit.  The other persons participating in this telemedicine service were none.     Patient ID: Misty Clarke, female    DOB: 1966-01-04, 53 y.o.   MRN: [id number]  HPI Chief Complaint  Patient presents with  . UTI    possible UTI, sensation- pressure starting midway of urination, started monday. no fever,    Complains of a 3 day history of dysuria, urgency frequent, urgency.  She is not taking anything for her symptoms. Denies fever, chills, back pain, abdominal pain, nausea, vomiting, diarrhea, vaginal discharge.  Last UTI years ago.  Denies history of recurrent UTI or pyelonephritis.  LMP: 09/14/18  Reports there is no chance of pregnancy.   Review of Systems Pertinent positives and negatives in the history of present illness.     Objective:   Physical Exam Wt 174 lb (78.9 kg)   BMI 27.25 kg/m   Alert and oriented and in no acute distress.  Respirations unlabored.  Normal mood, thought process.      Assessment & Plan:  Dysuria - Plan: nitrofurantoin, macrocrystal-monohydrate, (MACROBID) 100 MG capsule  Urinary frequency - Plan: nitrofurantoin, macrocrystal-monohydrate, (MACROBID) 100 MG capsule  Discussed limitations of virtual visit. No red flag symptoms.  We will treat her empirically for UTI. Antibiotic sent to pharmacy.  She will increase water intake may also drink cranberry juice since she likes this. Advised to follow-up if symptoms are not improving, if she is worsening or if she develops similar symptoms in the next few weeks.  Time spent on call was  15 minutes and in review of previous records 1 minutes total.  This virtual service is not related  to other E/M service within previous 7 days.

## 2018-12-14 DIAGNOSIS — R87618 Other abnormal cytological findings on specimens from cervix uteri: Secondary | ICD-10-CM | POA: Diagnosis not present

## 2018-12-14 DIAGNOSIS — Z124 Encounter for screening for malignant neoplasm of cervix: Secondary | ICD-10-CM | POA: Diagnosis not present

## 2018-12-14 DIAGNOSIS — Z01419 Encounter for gynecological examination (general) (routine) without abnormal findings: Secondary | ICD-10-CM | POA: Diagnosis not present

## 2018-12-14 DIAGNOSIS — R87610 Atypical squamous cells of undetermined significance on cytologic smear of cervix (ASC-US): Secondary | ICD-10-CM | POA: Diagnosis not present

## 2018-12-14 DIAGNOSIS — N760 Acute vaginitis: Secondary | ICD-10-CM | POA: Diagnosis not present

## 2018-12-14 DIAGNOSIS — N898 Other specified noninflammatory disorders of vagina: Secondary | ICD-10-CM | POA: Diagnosis not present

## 2018-12-15 LAB — HM PAP SMEAR

## 2019-01-18 DIAGNOSIS — L2089 Other atopic dermatitis: Secondary | ICD-10-CM | POA: Diagnosis not present

## 2019-04-13 ENCOUNTER — Other Ambulatory Visit: Payer: Self-pay | Admitting: Family Medicine

## 2019-04-13 ENCOUNTER — Other Ambulatory Visit: Payer: Self-pay | Admitting: Obstetrics and Gynecology

## 2019-04-13 DIAGNOSIS — Z1231 Encounter for screening mammogram for malignant neoplasm of breast: Secondary | ICD-10-CM

## 2019-05-04 ENCOUNTER — Encounter: Payer: Self-pay | Admitting: Family Medicine

## 2019-05-04 ENCOUNTER — Other Ambulatory Visit: Payer: Self-pay

## 2019-05-04 ENCOUNTER — Ambulatory Visit: Payer: BC Managed Care – PPO | Admitting: Family Medicine

## 2019-05-04 VITALS — BP 106/78 | HR 76 | Temp 97.1°F | Ht 67.75 in | Wt 174.0 lb

## 2019-05-04 DIAGNOSIS — R9431 Abnormal electrocardiogram [ECG] [EKG]: Secondary | ICD-10-CM

## 2019-05-04 DIAGNOSIS — Z0000 Encounter for general adult medical examination without abnormal findings: Secondary | ICD-10-CM | POA: Diagnosis not present

## 2019-05-04 DIAGNOSIS — Z23 Encounter for immunization: Secondary | ICD-10-CM | POA: Diagnosis not present

## 2019-05-04 DIAGNOSIS — J309 Allergic rhinitis, unspecified: Secondary | ICD-10-CM | POA: Diagnosis not present

## 2019-05-04 DIAGNOSIS — Z8249 Family history of ischemic heart disease and other diseases of the circulatory system: Secondary | ICD-10-CM | POA: Diagnosis not present

## 2019-05-04 DIAGNOSIS — E785 Hyperlipidemia, unspecified: Secondary | ICD-10-CM

## 2019-05-04 NOTE — Progress Notes (Signed)
   Subjective:    Patient ID: Misty Clarke, female    DOB: 1965-10-03, 53 y.o.   MRN: [id number]  HPI She is here for complete examination.  She gets regular gynecologic evaluation that has had a Pap smear done in May and also mammogram.  She does not smoke and rarely drinks.  She keeps himself in good physical shape.  Work and home life are going well.  She does have a family history of her father dying before age 78 of heart attack.  Blood work last year did show elevated lipid panel.  Presently she is not on any medications.  She does have underlying allergies that cause very little difficulty.  She has no other concerns or complaints.  Family and social history as well as health maintenance and immunizations was reviewed   Review of Systems  All other systems reviewed and are negative.      Objective:   Physical Exam Alert and in no distress. Tympanic membranes and canals are normal. Pharyngeal area is normal. Neck is supple without adenopathy or thyromegaly. Cardiac exam shows a regular sinus rhythm without murmurs or gallops. Lungs are clear to auscultation. Abdominal exam shows no masses or tenderness with normal bowel sounds. EKG does show evidence of LVH by voltage criteria      Assessment & Plan:  Routine general medical examination at a health care facility - Plan: CBC with Differential/Platelet, Comprehensive metabolic panel, Lipid panel  Need for influenza vaccination - Plan: Flu Vaccine QUAD 6+ mos PF IM (Fluarix Quad PF)  Allergic rhinitis, mild  Family history of heart disease in female family member before age 14 - Plan: CBC with Differential/Platelet, Comprehensive metabolic panel, Lipid panel, EKG 12-Lead  Hyperlipidemia, unspecified hyperlipidemia type  Abnormal EKG - Plan: ECHOCARDIOGRAM COMPLETE I encouraged her to continue to take good care of herself.  Discussed information concerning routine Pap as well as mammogram with her.  Also discussed family history of  heart disease.  Explained I will place her on a statin drug based on her blood work.

## 2019-05-05 LAB — COMPREHENSIVE METABOLIC PANEL
ALT: 20 IU/L (ref 0–32)
AST: 21 IU/L (ref 0–40)
Albumin/Globulin Ratio: 2 (ref 1.2–2.2)
Albumin: 4.8 g/dL (ref 3.8–4.9)
Alkaline Phosphatase: 74 IU/L (ref 39–117)
BUN/Creatinine Ratio: 11 (ref 9–23)
BUN: 10 mg/dL (ref 6–24)
Bilirubin Total: 1 mg/dL (ref 0.0–1.2)
CO2: 20 mmol/L (ref 20–29)
Calcium: 9.5 mg/dL (ref 8.7–10.2)
Chloride: 102 mmol/L (ref 96–106)
Creatinine, Ser: 0.87 mg/dL (ref 0.57–1.00)
GFR calc Af Amer: 89 mL/min/{1.73_m2} (ref >59)
GFR calc non Af Amer: 77 mL/min/{1.73_m2} (ref >59)
Globulin, Total: 2.4 g/dL (ref 1.5–4.5)
Glucose: 83 mg/dL (ref 65–99)
Potassium: 4.4 mmol/L (ref 3.5–5.2)
Sodium: 139 mmol/L (ref 134–144)
Total Protein: 7.2 g/dL (ref 6.0–8.5)

## 2019-05-05 LAB — CBC WITH DIFFERENTIAL/PLATELET
Basophils Absolute: 0.1 10*3/uL (ref 0.0–0.2)
Basos: 1 %
EOS (ABSOLUTE): 0.1 10*3/uL (ref 0.0–0.4)
Eos: 1 %
Hematocrit: 39.3 % (ref 34.0–46.6)
Hemoglobin: 13.5 g/dL (ref 11.1–15.9)
Immature Grans (Abs): 0 10*3/uL (ref 0.0–0.1)
Immature Granulocytes: 0 %
Lymphocytes Absolute: 1.9 10*3/uL (ref 0.7–3.1)
Lymphs: 33 %
MCH: 29.2 pg (ref 26.6–33.0)
MCHC: 34.4 g/dL (ref 31.5–35.7)
MCV: 85 fL (ref 79–97)
Monocytes Absolute: 0.4 10*3/uL (ref 0.1–0.9)
Monocytes: 7 %
Neutrophils Absolute: 3.3 10*3/uL (ref 1.4–7.0)
Neutrophils: 58 %
Platelets: 282 10*3/uL (ref 150–450)
RBC: 4.62 x10E6/uL (ref 3.77–5.28)
RDW: 14.2 % (ref 11.7–15.4)
WBC: 5.7 10*3/uL (ref 3.4–10.8)

## 2019-05-05 LAB — LIPID PANEL
Chol/HDL Ratio: 3.3 ratio (ref 0.0–4.4)
Cholesterol, Total: 218 mg/dL — ABNORMAL HIGH (ref 100–199)
HDL: 66 mg/dL (ref >39)
LDL Chol Calc (NIH): 140 mg/dL — ABNORMAL HIGH (ref 0–99)
Triglycerides: 68 mg/dL (ref 0–149)
VLDL Cholesterol Cal: 12 mg/dL (ref 5–40)

## 2019-05-05 MED ORDER — ATORVASTATIN CALCIUM 20 MG PO TABS: 20.0000 mg | ORAL_TABLET | Freq: Every day | ORAL | 3 refills | Status: DC

## 2019-05-05 NOTE — Addendum Note (Signed)
Addended by: Denita Lung on: 05/05/2019 08:12 AM   Modules accepted: Orders

## 2019-05-06 ENCOUNTER — Telehealth: Payer: Self-pay | Admitting: Family Medicine

## 2019-05-06 NOTE — Telephone Encounter (Signed)
Received requested records from [facility] 

## 2019-05-10 ENCOUNTER — Encounter (HOSPITAL_COMMUNITY): Payer: Self-pay | Admitting: Family Medicine

## 2019-05-13 ENCOUNTER — Ambulatory Visit: Payer: BLUE CROSS/BLUE SHIELD

## 2019-05-18 ENCOUNTER — Ambulatory Visit (HOSPITAL_COMMUNITY): Payer: BC Managed Care – PPO | Attending: Cardiovascular Disease

## 2019-05-18 ENCOUNTER — Other Ambulatory Visit: Payer: Self-pay

## 2019-05-18 DIAGNOSIS — R9431 Abnormal electrocardiogram [ECG] [EKG]: Secondary | ICD-10-CM

## 2019-05-24 ENCOUNTER — Telehealth: Payer: Self-pay | Admitting: Family Medicine

## 2019-05-24 NOTE — Telephone Encounter (Signed)
Received fax response in reference to a medical records request we sent to facility. They replied no records available.

## 2019-05-25 ENCOUNTER — Encounter: Payer: Self-pay | Admitting: Family Medicine

## 2019-06-13 ENCOUNTER — Other Ambulatory Visit: Payer: Self-pay

## 2019-06-13 ENCOUNTER — Ambulatory Visit
Admission: RE | Admit: 2019-06-13 | Discharge: 2019-06-13 | Disposition: A | Discharge status: Home / Self Care | Payer: BC Managed Care – PPO | Source: Ambulatory Visit | Attending: Obstetrics and Gynecology | Admitting: Obstetrics and Gynecology

## 2019-06-13 DIAGNOSIS — Z1231 Encounter for screening mammogram for malignant neoplasm of breast: Secondary | ICD-10-CM

## 2019-06-14 ENCOUNTER — Other Ambulatory Visit: Payer: Self-pay | Admitting: Obstetrics and Gynecology

## 2019-06-14 DIAGNOSIS — R928 Other abnormal and inconclusive findings on diagnostic imaging of breast: Secondary | ICD-10-CM

## 2019-06-21 ENCOUNTER — Ambulatory Visit
Admission: RE | Admit: 2019-06-21 | Discharge: 2019-06-21 | Disposition: A | Discharge status: Home / Self Care | Payer: BC Managed Care – PPO | Source: Ambulatory Visit | Attending: Obstetrics and Gynecology | Admitting: Obstetrics and Gynecology

## 2019-06-21 ENCOUNTER — Other Ambulatory Visit: Payer: Self-pay

## 2019-06-21 DIAGNOSIS — R928 Other abnormal and inconclusive findings on diagnostic imaging of breast: Secondary | ICD-10-CM

## 2019-06-21 DIAGNOSIS — R922 Inconclusive mammogram: Secondary | ICD-10-CM | POA: Diagnosis not present

## 2019-06-21 DIAGNOSIS — N6489 Other specified disorders of breast: Secondary | ICD-10-CM | POA: Diagnosis not present

## 2019-07-18 ENCOUNTER — Other Ambulatory Visit: Payer: BC Managed Care – PPO

## 2019-07-18 ENCOUNTER — Other Ambulatory Visit: Payer: Self-pay

## 2019-07-18 DIAGNOSIS — E785 Hyperlipidemia, unspecified: Secondary | ICD-10-CM

## 2019-07-19 LAB — LIPID PANEL
Chol/HDL Ratio: 2.2 ratio (ref 0.0–4.4)
Cholesterol, Total: 127 mg/dL (ref 100–199)
HDL: 57 mg/dL (ref >39)
LDL Chol Calc (NIH): 57 mg/dL (ref 0–99)
Triglycerides: 58 mg/dL (ref 0–149)
VLDL Cholesterol Cal: 13 mg/dL (ref 5–40)

## 2019-07-25 DIAGNOSIS — H52203 Unspecified astigmatism, bilateral: Secondary | ICD-10-CM | POA: Diagnosis not present

## 2019-07-25 DIAGNOSIS — H5213 Myopia, bilateral: Secondary | ICD-10-CM | POA: Diagnosis not present

## 2019-07-25 DIAGNOSIS — H524 Presbyopia: Secondary | ICD-10-CM | POA: Diagnosis not present

## 2019-10-08 DIAGNOSIS — Z23 Encounter for immunization: Secondary | ICD-10-CM | POA: Diagnosis not present

## 2019-12-09 IMAGING — MG DIGITAL SCREENING BILATERAL MAMMOGRAM WITH CAD
4 series · 4 of 4 positions shown · non-contrast
Comparison: Previous exam(s).

CLINICAL DATA: Screening.

EXAM:
DIGITAL SCREENING BILATERAL MAMMOGRAM WITH CAD

[R MLO]
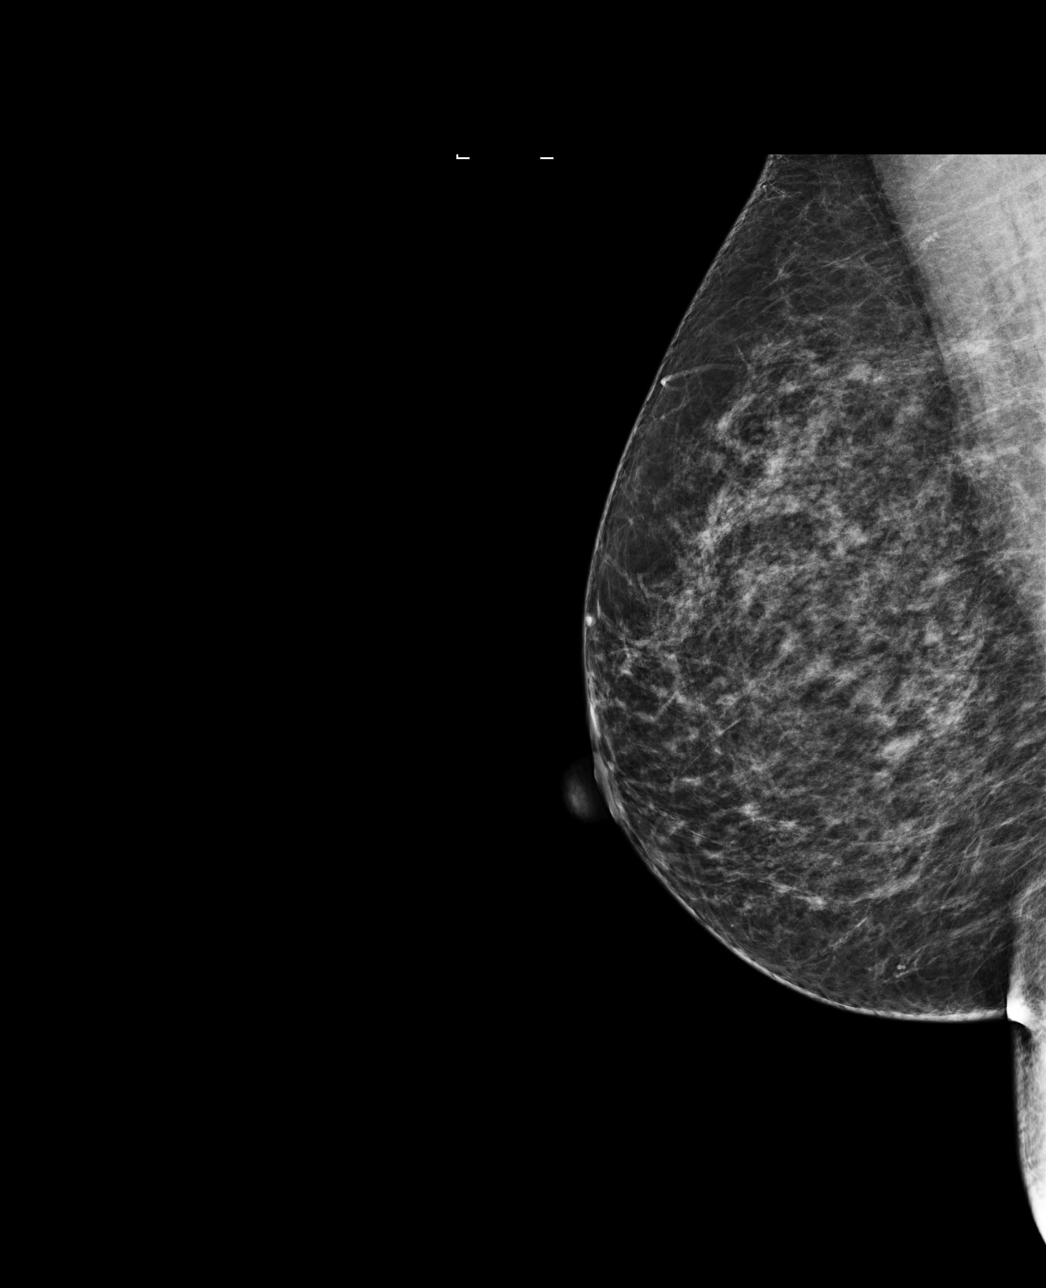

[L CC]
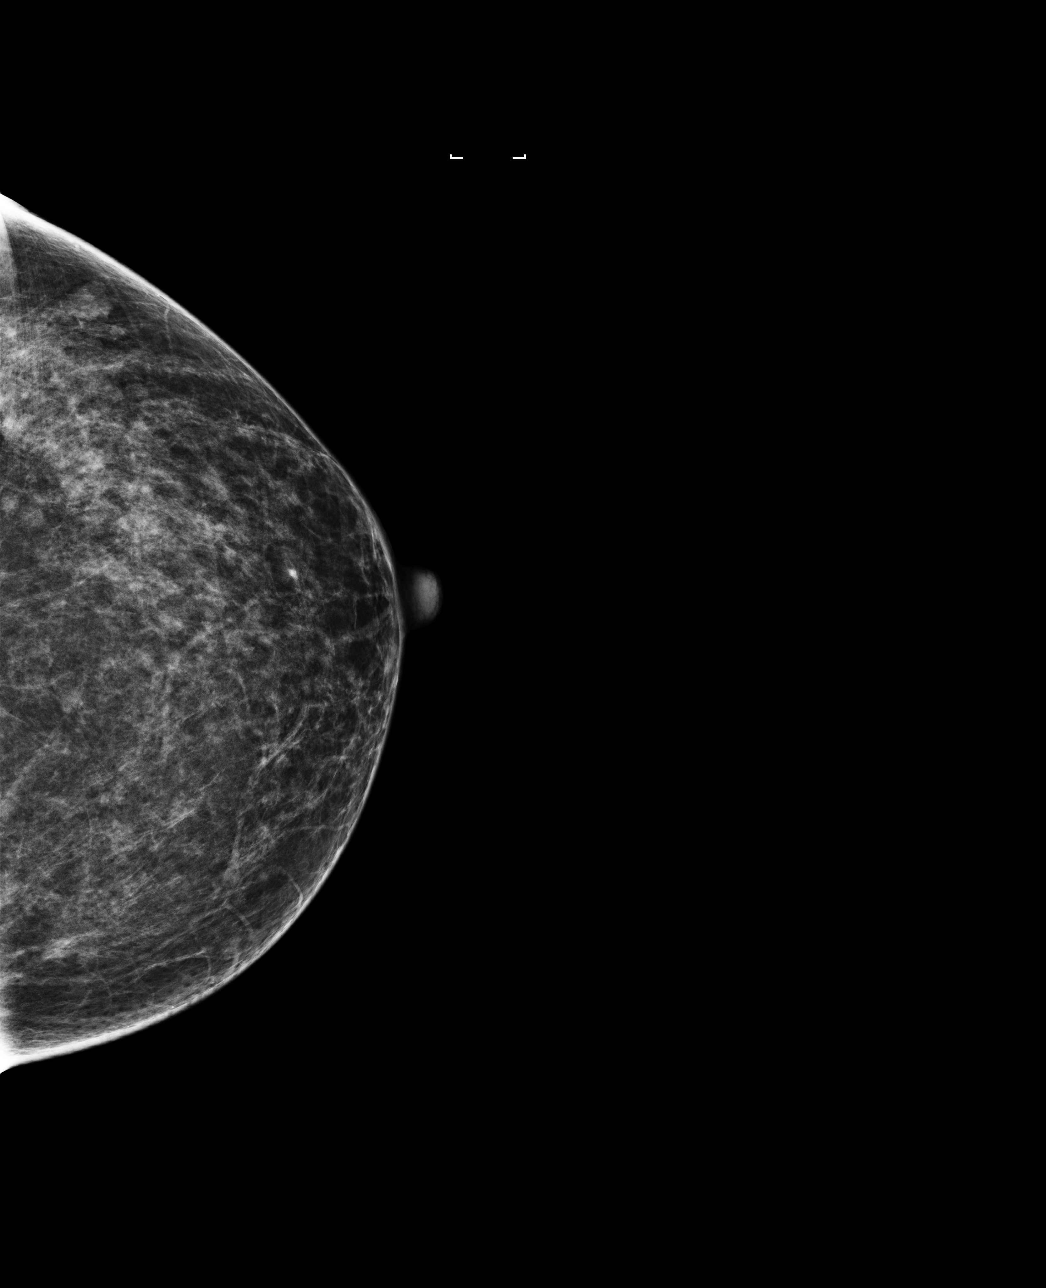

[R CC]
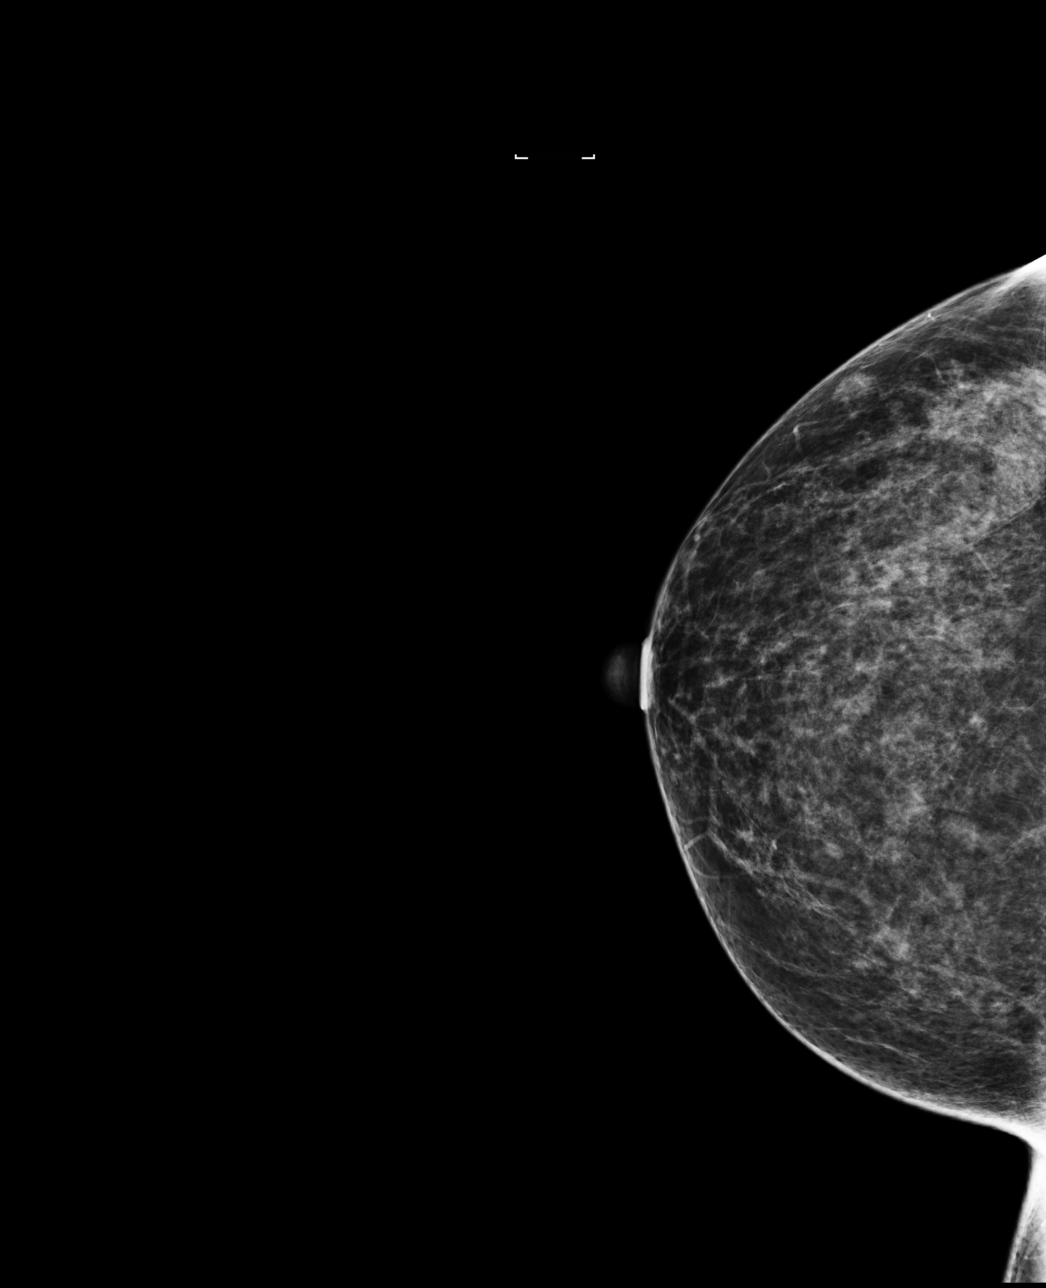

[L MLO]
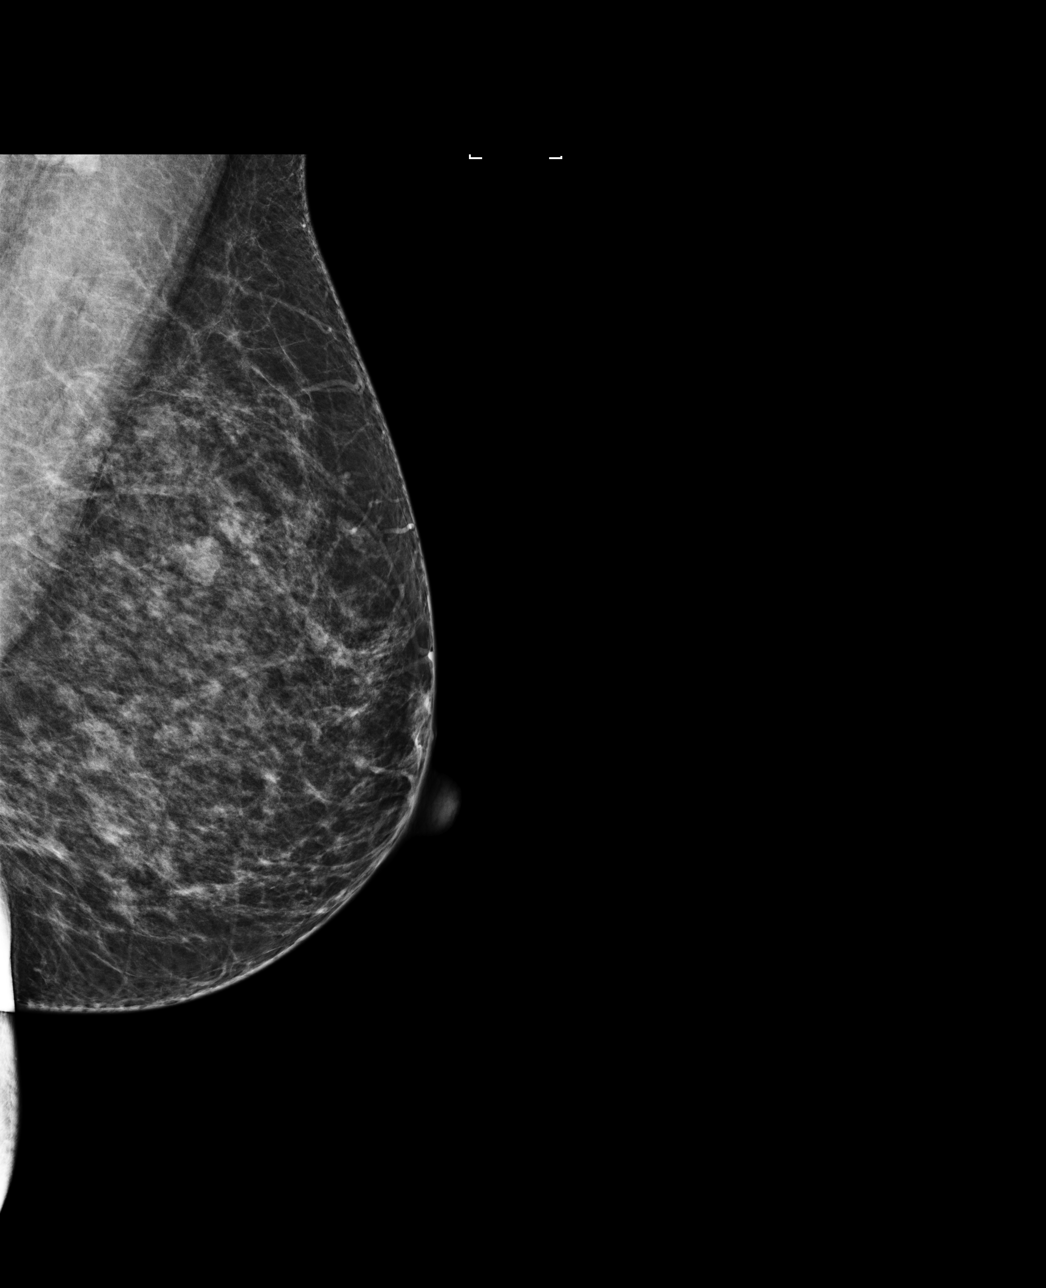

[4 of 4 positions shown; findings below may reference images not displayed]

ACR Breast Density Category c: The breast tissue is heterogeneously
dense, which may obscure small masses.
FINDINGS: There are no findings suspicious for malignancy. Images were
processed with CAD.
IMPRESSION: No mammographic evidence of malignancy. A result letter of this
screening mammogram will be mailed directly to the patient.

RECOMMENDATION:
Screening mammogram in one year. (Code:YJ-[id number])

BI-RADS CATEGORY  1: Negative.

## 2019-12-20 DIAGNOSIS — Z01419 Encounter for gynecological examination (general) (routine) without abnormal findings: Secondary | ICD-10-CM | POA: Diagnosis not present

## 2019-12-20 DIAGNOSIS — Z6827 Body mass index (BMI) 27.0-27.9, adult: Secondary | ICD-10-CM | POA: Diagnosis not present

## 2019-12-20 DIAGNOSIS — N951 Menopausal and female climacteric states: Secondary | ICD-10-CM | POA: Diagnosis not present

## 2020-01-02 DIAGNOSIS — S66113A Strain of flexor muscle, fascia and tendon of left middle finger at wrist and hand level, initial encounter: Secondary | ICD-10-CM | POA: Diagnosis not present

## 2020-02-28 ENCOUNTER — Ambulatory Visit: Payer: BC Managed Care – PPO | Admitting: Family Medicine

## 2020-02-28 ENCOUNTER — Encounter: Payer: Self-pay | Admitting: Family Medicine

## 2020-02-28 ENCOUNTER — Other Ambulatory Visit: Payer: Self-pay

## 2020-02-28 VITALS — BP 120/70 | HR 82 | Temp 98.1°F | Wt 179.6 lb

## 2020-02-28 DIAGNOSIS — Z8739 Personal history of other diseases of the musculoskeletal system and connective tissue: Secondary | ICD-10-CM

## 2020-02-28 DIAGNOSIS — M25469 Effusion, unspecified knee: Secondary | ICD-10-CM

## 2020-02-28 DIAGNOSIS — Z87828 Personal history of other (healed) physical injury and trauma: Secondary | ICD-10-CM

## 2020-02-28 DIAGNOSIS — L989 Disorder of the skin and subcutaneous tissue, unspecified: Secondary | ICD-10-CM

## 2020-02-28 NOTE — Patient Instructions (Signed)
Go to [facility] for your knee X ray  Use ice and elevate your knee. Ok to take ibuprofen as you have been.    Dermatology offices  [facility]: Phone #: [phone] Address: [address], [place], [place] [address]  [facility]: Phone: [phone]  Address: [address], [place], [place] [address]  [facility]: [phone] Address: [address] [place], [place] [address]  [facility] Address: [place], [place], [place] [address] Phone: [phone]

## 2020-02-28 NOTE — Progress Notes (Signed)
   Subjective:    Patient ID: Misty Clarke, female    DOB: Mar 16, 1966, 54 y.o.   MRN: [id number]  HPI Chief Complaint  Patient presents with  . stiffness    stiffness in knee for 3 weeks. strained middle finger and still hurts   Complains of right knee tightness for the past 3 weeks. No known injury that she is aware. 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  If she does not take ibuprofen she has pain.  She has not needed ibuprofen otherwise.  No hx of knee injury, pain or surgery.  History of left knee swelling and had fluid drained approximately 2 years ago.   States she sprained the middle finger on her left hand July 5th. States her finger is still sore and tight. She went to an urgent care for this after it happened. No weakness.    She also has an issue with skin discoloration and pruritic area following healed rash to her right arm and the dorsum of her right hand. This has been an issue for the past year or so. States she has been using topical steroid prn.  She would like to see a dermatologist.   No other arthralgias or myalgias.  Denies fever, chills, dizziness, chest pain, palpitations, shortness of breath, abdominal pain, N/V/D.      Review of Systems Pertinent positives and negatives in the history of present illness.     Objective:   Physical Exam Musculoskeletal:     Right hand: Normal.     Left hand: Normal. No tenderness. Normal range of motion. Normal strength. Normal sensation. Normal capillary refill.     Right knee: Normal range of motion. No tenderness.     Left knee: Normal.     Comments: Mild effusion to lateral superior aspect of her right knee  Skin:    General: Skin is warm and dry.     Capillary Refill: Capillary refill takes less than 2 seconds.     Comments: Hyperpigmentation of the dorsum of her right hand.     BP 120/70   Pulse 82   Temp 98.1 F (36.7 C)   Wt 179 lb 9.6 oz (81.5 kg)   BMI 27.51  kg/m        Assessment & Plan:  Swelling of knee - Plan: DG Knee Complete 4 Views Right  Skin abnormality  History of sprain of interphalangeal joint of finger  I will send her for an x-ray of her right knee.  She has swelling but no pain and the exam is otherwise unremarkable.  Recommend she continue taking an NSAID as needed but no more than 2 days/week.  Recommend icing and elevation as well as compression if needed. Recommend she give her injured finger more time to heal.  Finger exam is unremarkable. Recommend she see dermatology for the persistent rash on her right hand.  I provided her with a list of dermatology offices. Recommend she follow-up here with her PCP, Dr. Redmond School, if her knee continues giving her trouble or swelling.

## 2020-03-02 ENCOUNTER — Other Ambulatory Visit: Payer: Self-pay

## 2020-03-02 ENCOUNTER — Ambulatory Visit
Admission: RE | Admit: 2020-03-02 | Discharge: 2020-03-02 | Disposition: A | Discharge status: Home / Self Care | Payer: BC Managed Care – PPO | Source: Ambulatory Visit | Attending: Family Medicine | Admitting: Family Medicine

## 2020-03-02 DIAGNOSIS — M25469 Effusion, unspecified knee: Secondary | ICD-10-CM

## 2020-03-02 DIAGNOSIS — M7989 Other specified soft tissue disorders: Secondary | ICD-10-CM | POA: Diagnosis not present

## 2020-05-08 ENCOUNTER — Encounter: Payer: Self-pay | Admitting: Family Medicine

## 2020-05-08 ENCOUNTER — Other Ambulatory Visit: Payer: Self-pay | Admitting: Family Medicine

## 2020-05-08 ENCOUNTER — Other Ambulatory Visit: Payer: Self-pay

## 2020-05-08 ENCOUNTER — Ambulatory Visit: Payer: BC Managed Care – PPO | Admitting: Family Medicine

## 2020-05-08 VITALS — BP 110/72 | HR 70 | Temp 96.8°F | Ht 67.75 in | Wt 179.0 lb

## 2020-05-08 DIAGNOSIS — J309 Allergic rhinitis, unspecified: Secondary | ICD-10-CM | POA: Diagnosis not present

## 2020-05-08 DIAGNOSIS — Z0000 Encounter for general adult medical examination without abnormal findings: Secondary | ICD-10-CM

## 2020-05-08 DIAGNOSIS — Z1159 Encounter for screening for other viral diseases: Secondary | ICD-10-CM | POA: Diagnosis not present

## 2020-05-08 DIAGNOSIS — N951 Menopausal and female climacteric states: Secondary | ICD-10-CM | POA: Diagnosis not present

## 2020-05-08 DIAGNOSIS — N930 Postcoital and contact bleeding: Secondary | ICD-10-CM

## 2020-05-08 DIAGNOSIS — Z8249 Family history of ischemic heart disease and other diseases of the circulatory system: Secondary | ICD-10-CM | POA: Diagnosis not present

## 2020-05-08 DIAGNOSIS — E785 Hyperlipidemia, unspecified: Secondary | ICD-10-CM

## 2020-05-08 DIAGNOSIS — Z833 Family history of diabetes mellitus: Secondary | ICD-10-CM

## 2020-05-08 DIAGNOSIS — Z23 Encounter for immunization: Secondary | ICD-10-CM | POA: Diagnosis not present

## 2020-05-08 DIAGNOSIS — Z1231 Encounter for screening mammogram for malignant neoplasm of breast: Secondary | ICD-10-CM

## 2020-05-08 LAB — LIPID PANEL

## 2020-05-08 MED ORDER — ATORVASTATIN CALCIUM 20 MG PO TABS: 20.0000 mg | ORAL_TABLET | Freq: Every day | ORAL | 3 refills | Status: DC

## 2020-05-08 NOTE — Patient Instructions (Signed)
Consider trying black cohosh for your menopausal symptoms.Misty Clarke

## 2020-05-08 NOTE — Progress Notes (Signed)
   Subjective:    Patient ID: Misty Clarke, female    DOB: 1965-08-22, 54 y.o.   MRN: [id number]  HPI She is here for complete examination.  She has no particular concerns or complaints.  She does have a family history of heart disease as well as diabetes.  She has been having difficulty with menopausal symptoms including postcoital bleeding.  She presently is taking Lipitor and having no difficulty with that.  She is also on a multivitamin.  She did get the J&J vaccine and would like to have a booster.  Her allergies seem to be under good control.  Her work and home life are going well.  She rarely drinks and does keep her self physically active.  Family and social history as well as health maintenance and immunizations was reviewed   Review of Systems  All other systems reviewed and are negative.      Objective:   Physical Exam Alert and in no distress. Tympanic membranes and canals are normal. Pharyngeal area is normal. Neck is supple without adenopathy or thyromegaly. Cardiac exam shows a regular sinus rhythm without murmurs or gallops. Lungs are clear to auscultation.  Abdominal exam shows active bowel sounds without mass or tenderness.        Assessment & Plan:  Routine general medical examination at a health care facility - Plan: CBC with Differential/Platelet, Comprehensive metabolic panel, Lipid panel  Allergic rhinitis, mild  Family history of heart disease in female family member before age 96  Family history of diabetes mellitus in father  Menopausal syndrome  Postcoital bleeding  Immunization, viral disease - Plan: Moderna SARS-CoV-2 Vaccine  Need for influenza vaccination - Plan: Flu Vaccine QUAD 36+ mos IM  Need for hepatitis C screening test - Plan: Hepatitis C antibody  Hyperlipidemia, unspecified hyperlipidemia type - Plan: atorvastatin (LIPITOR) 20 MG tablet  Encouraged her to remain physically active.  Discussed treatment of allergies with OTC medications.   Continue to monitor for diabetes and cardiac symptoms.  I will renew her Lipitor. Discussed the menopausal symptoms and the use of black cohosh and also other therapies including HRT and SSRIs.  Also discussed using vaginal lubricants to help with any irritation.

## 2020-05-09 LAB — COMPREHENSIVE METABOLIC PANEL
ALT: 28 IU/L (ref 0–32)
AST: 26 IU/L (ref 0–40)
Albumin/Globulin Ratio: 2 (ref 1.2–2.2)
Albumin: 5.3 g/dL — ABNORMAL HIGH (ref 3.8–4.9)
Alkaline Phosphatase: 83 IU/L (ref 44–121)
BUN/Creatinine Ratio: 10 (ref 9–23)
BUN: 10 mg/dL (ref 6–24)
Bilirubin Total: 0.9 mg/dL (ref 0.0–1.2)
CO2: 24 mmol/L (ref 20–29)
Calcium: 9.4 mg/dL (ref 8.7–10.2)
Chloride: 102 mmol/L (ref 96–106)
Creatinine, Ser: 0.97 mg/dL (ref 0.57–1.00)
GFR calc Af Amer: 78 mL/min/{1.73_m2} (ref >59)
GFR calc non Af Amer: 67 mL/min/{1.73_m2} (ref >59)
Globulin, Total: 2.6 g/dL (ref 1.5–4.5)
Glucose: 87 mg/dL (ref 65–99)
Potassium: 4.6 mmol/L (ref 3.5–5.2)
Sodium: 140 mmol/L (ref 134–144)
Total Protein: 7.9 g/dL (ref 6.0–8.5)

## 2020-05-09 LAB — LIPID PANEL
Chol/HDL Ratio: 2.1 ratio (ref 0.0–4.4)
Cholesterol, Total: 164 mg/dL (ref 100–199)
HDL: 77 mg/dL (ref >39)
LDL Chol Calc (NIH): 76 mg/dL (ref 0–99)
Triglycerides: 55 mg/dL (ref 0–149)
VLDL Cholesterol Cal: 11 mg/dL (ref 5–40)

## 2020-05-09 LAB — CBC WITH DIFFERENTIAL/PLATELET
Basophils Absolute: 0.1 10*3/uL (ref 0.0–0.2)
Basos: 1 %
EOS (ABSOLUTE): 0.1 10*3/uL (ref 0.0–0.4)
Eos: 2 %
Hematocrit: 34 % (ref 34.0–46.6)
Hemoglobin: 11 g/dL — ABNORMAL LOW (ref 11.1–15.9)
Immature Grans (Abs): 0 10*3/uL (ref 0.0–0.1)
Immature Granulocytes: 0 %
Lymphocytes Absolute: 2.4 10*3/uL (ref 0.7–3.1)
Lymphs: 41 %
MCH: 26.6 pg (ref 26.6–33.0)
MCHC: 32.4 g/dL (ref 31.5–35.7)
MCV: 82 fL (ref 79–97)
Monocytes Absolute: 0.6 10*3/uL (ref 0.1–0.9)
Monocytes: 9 %
Neutrophils Absolute: 2.8 10*3/uL (ref 1.4–7.0)
Neutrophils: 47 %
Platelets: 365 10*3/uL (ref 150–450)
RBC: 4.14 x10E6/uL (ref 3.77–5.28)
RDW: 15.7 % — ABNORMAL HIGH (ref 11.7–15.4)
WBC: 5.9 10*3/uL (ref 3.4–10.8)

## 2020-05-09 LAB — HEPATITIS C ANTIBODY: Hep C Virus Ab: <0.1 s/co ratio (ref 0.0–0.9)

## 2020-06-20 ENCOUNTER — Other Ambulatory Visit: Payer: Self-pay

## 2020-06-20 ENCOUNTER — Ambulatory Visit
Admission: RE | Admit: 2020-06-20 | Discharge: 2020-06-20 | Disposition: A | Discharge status: Home / Self Care | Payer: BC Managed Care – PPO | Source: Ambulatory Visit | Attending: Family Medicine | Admitting: Family Medicine

## 2020-06-20 DIAGNOSIS — Z1231 Encounter for screening mammogram for malignant neoplasm of breast: Secondary | ICD-10-CM

## 2020-07-30 DIAGNOSIS — L301 Dyshidrosis [pompholyx]: Secondary | ICD-10-CM | POA: Diagnosis not present

## 2020-07-31 DIAGNOSIS — H524 Presbyopia: Secondary | ICD-10-CM | POA: Diagnosis not present

## 2020-07-31 DIAGNOSIS — H5213 Myopia, bilateral: Secondary | ICD-10-CM | POA: Diagnosis not present

## 2020-07-31 DIAGNOSIS — H43813 Vitreous degeneration, bilateral: Secondary | ICD-10-CM | POA: Diagnosis not present

## 2020-09-03 ENCOUNTER — Other Ambulatory Visit: Payer: Self-pay | Admitting: Family Medicine

## 2020-09-03 DIAGNOSIS — E785 Hyperlipidemia, unspecified: Secondary | ICD-10-CM

## 2020-09-17 DIAGNOSIS — L301 Dyshidrosis [pompholyx]: Secondary | ICD-10-CM | POA: Diagnosis not present

## 2020-12-28 DIAGNOSIS — Z01419 Encounter for gynecological examination (general) (routine) without abnormal findings: Secondary | ICD-10-CM | POA: Diagnosis not present

## 2020-12-28 DIAGNOSIS — B373 Candidiasis of vulva and vagina: Secondary | ICD-10-CM | POA: Diagnosis not present

## 2020-12-28 DIAGNOSIS — Z1211 Encounter for screening for malignant neoplasm of colon: Secondary | ICD-10-CM | POA: Diagnosis not present

## 2020-12-28 DIAGNOSIS — N951 Menopausal and female climacteric states: Secondary | ICD-10-CM | POA: Diagnosis not present

## 2021-02-01 ENCOUNTER — Telehealth: Payer: Self-pay | Admitting: Family Medicine

## 2021-02-01 DIAGNOSIS — E785 Hyperlipidemia, unspecified: Secondary | ICD-10-CM

## 2021-02-01 MED ORDER — ATORVASTATIN CALCIUM 20 MG PO TABS: ORAL_TABLET | ORAL | 0 refills | Status: DC

## 2021-02-01 NOTE — Telephone Encounter (Signed)
done

## 2021-02-01 NOTE — Telephone Encounter (Signed)
  Atorvastatin cal 20

## 2021-04-02 MED ORDER — ATORVASTATIN CALCIUM 20 MG PO TABS: ORAL_TABLET | ORAL | 0 refills | Status: DC

## 2021-04-10 ENCOUNTER — Other Ambulatory Visit: Payer: Self-pay

## 2021-04-10 DIAGNOSIS — E785 Hyperlipidemia, unspecified: Secondary | ICD-10-CM

## 2021-04-10 MED ORDER — ATORVASTATIN CALCIUM 20 MG PO TABS: ORAL_TABLET | ORAL | 0 refills | Status: DC

## 2021-05-08 ENCOUNTER — Other Ambulatory Visit: Payer: Self-pay | Admitting: Family Medicine

## 2021-05-08 DIAGNOSIS — Z1231 Encounter for screening mammogram for malignant neoplasm of breast: Secondary | ICD-10-CM

## 2021-05-09 ENCOUNTER — Other Ambulatory Visit: Payer: Self-pay

## 2021-05-09 ENCOUNTER — Encounter: Payer: Self-pay | Admitting: Family Medicine

## 2021-05-09 ENCOUNTER — Ambulatory Visit: Payer: BC Managed Care – PPO | Admitting: Family Medicine

## 2021-05-09 VITALS — BP 120/78 | HR 74 | Temp 97.2°F | Ht 67.75 in | Wt 180.4 lb

## 2021-05-09 DIAGNOSIS — Z8249 Family history of ischemic heart disease and other diseases of the circulatory system: Secondary | ICD-10-CM

## 2021-05-09 DIAGNOSIS — Z0000 Encounter for general adult medical examination without abnormal findings: Secondary | ICD-10-CM | POA: Diagnosis not present

## 2021-05-09 DIAGNOSIS — Z833 Family history of diabetes mellitus: Secondary | ICD-10-CM

## 2021-05-09 DIAGNOSIS — E785 Hyperlipidemia, unspecified: Secondary | ICD-10-CM

## 2021-05-09 DIAGNOSIS — Z23 Encounter for immunization: Secondary | ICD-10-CM | POA: Diagnosis not present

## 2021-05-09 DIAGNOSIS — J309 Allergic rhinitis, unspecified: Secondary | ICD-10-CM

## 2021-05-09 DIAGNOSIS — Z1211 Encounter for screening for malignant neoplasm of colon: Secondary | ICD-10-CM

## 2021-05-09 LAB — CBC WITH DIFFERENTIAL/PLATELET
Basophils Absolute: 0.1 10*3/uL (ref 0.0–0.2)
Basos: 1 %
EOS (ABSOLUTE): 0.1 10*3/uL (ref 0.0–0.4)
Eos: 1 %
Hematocrit: 35.6 % (ref 34.0–46.6)
Hemoglobin: 12.3 g/dL (ref 11.1–15.9)
Immature Grans (Abs): 0 10*3/uL (ref 0.0–0.1)
Immature Granulocytes: 0 %
Lymphocytes Absolute: 1.8 10*3/uL (ref 0.7–3.1)
Lymphs: 35 %
MCH: 30 pg (ref 26.6–33.0)
MCHC: 34.6 g/dL (ref 31.5–35.7)
MCV: 87 fL (ref 79–97)
Monocytes Absolute: 0.4 10*3/uL (ref 0.1–0.9)
Monocytes: 8 %
Neutrophils Absolute: 2.8 10*3/uL (ref 1.4–7.0)
Neutrophils: 55 %
Platelets: 320 10*3/uL (ref 150–450)
RBC: 4.1 x10E6/uL (ref 3.77–5.28)
RDW: 12.1 % (ref 11.7–15.4)
WBC: 5 10*3/uL (ref 3.4–10.8)

## 2021-05-09 LAB — COMPREHENSIVE METABOLIC PANEL
ALT: 38 IU/L — ABNORMAL HIGH (ref 0–32)
AST: 32 IU/L (ref 0–40)
Albumin/Globulin Ratio: 2.2 (ref 1.2–2.2)
Albumin: 5 g/dL — ABNORMAL HIGH (ref 3.8–4.9)
Alkaline Phosphatase: 134 IU/L — ABNORMAL HIGH (ref 44–121)
BUN/Creatinine Ratio: 13 (ref 9–23)
BUN: 12 mg/dL (ref 6–24)
Bilirubin Total: 0.8 mg/dL (ref 0.0–1.2)
CO2: 25 mmol/L (ref 20–29)
Calcium: 10.1 mg/dL (ref 8.7–10.2)
Chloride: 101 mmol/L (ref 96–106)
Creatinine, Ser: 0.94 mg/dL (ref 0.57–1.00)
Globulin, Total: 2.3 g/dL (ref 1.5–4.5)
Glucose: 95 mg/dL (ref 70–99)
Potassium: 4.6 mmol/L (ref 3.5–5.2)
Sodium: 138 mmol/L (ref 134–144)
Total Protein: 7.3 g/dL (ref 6.0–8.5)
eGFR: 73 mL/min/{1.73_m2} (ref >59)

## 2021-05-09 LAB — LIPID PANEL
Chol/HDL Ratio: 2.3 ratio (ref 0.0–4.4)
Cholesterol, Total: 169 mg/dL (ref 100–199)
HDL: 75 mg/dL (ref >39)
LDL Chol Calc (NIH): 83 mg/dL (ref 0–99)
Triglycerides: 55 mg/dL (ref 0–149)
VLDL Cholesterol Cal: 11 mg/dL (ref 5–40)

## 2021-05-09 MED ORDER — ATORVASTATIN CALCIUM 20 MG PO TABS: ORAL_TABLET | ORAL | 3 refills | Status: DC

## 2021-05-09 NOTE — Progress Notes (Signed)
Misty Clarke am starting a note on Misty Clarke  Subjective:    Patient ID: Misty Clarke, female    DOB: 30-Jul-1965, 55 y.o.   MRN: [id number]  HPI She is here for complete exam ;she has no particular concerns or questions.  She continues on Lipitor and is having no difficulty with that.  Her allergies seem to be under good control and she is not using any medications at the present time.  She exercises regularly.  Does not smoke or drink.  Work and home life are going quite well.  Otherwise family and social history as well as health maintenance and  immunizations was reviewed.   Review of Systems  All other systems reviewed and are negative.     Objective:   Physical Exam Alert and in no distress. Tympanic membranes and canals are normal. Pharyngeal area is normal. Neck is supple without adenopathy or thyromegaly. Cardiac exam shows a regular sinus rhythm without murmurs or gallops. Lungs are clear to auscultation.  Abdominal exam shows no masses or tenderness with normal bowel sounds.         Assessment & Plan:  Routine general medical examination at a health care facility - Plan: CBC with Differential/Platelet, Comprehensive metabolic panel, Lipid panel  Allergic rhinitis, mild  Hyperlipidemia, unspecified hyperlipidemia type - Plan: Lipid panel  Family history of heart disease in female family member before age 59 - Plan: Lipid panel  Family history of diabetes mellitus in father - Plan: Comprehensive metabolic panel  Need for influenza vaccination - Plan: Flu Vaccine QUAD 62mo+IM (Fluarix, Fluzone & Alfiuria Quad PF)  Immunization, viral disease - Plan: Moderna Covid-19 Vaccine Bivalent Booster  Screening for colon cancer - Plan: Cologuard I encouraged her to continue to take good care of her self.  Use allergy meds on an as-needed basis.

## 2021-05-10 NOTE — Addendum Note (Signed)
Addended by: Denita Lung on: 05/10/2021 12:32 PM   Modules accepted: Orders

## 2021-05-14 NOTE — Progress Notes (Signed)
Pt has seen on my chart and sent her a message to call and schedule a nurse visit. [place]

## 2021-06-05 DIAGNOSIS — Z1211 Encounter for screening for malignant neoplasm of colon: Secondary | ICD-10-CM | POA: Diagnosis not present

## 2021-06-10 LAB — COLOGUARD: COLOGUARD: POSITIVE — AB

## 2021-06-12 ENCOUNTER — Other Ambulatory Visit: Payer: Self-pay

## 2021-06-12 DIAGNOSIS — Z1211 Encounter for screening for malignant neoplasm of colon: Secondary | ICD-10-CM

## 2021-06-25 ENCOUNTER — Ambulatory Visit
Admission: RE | Admit: 2021-06-25 | Discharge: 2021-06-25 | Disposition: A | Discharge status: Home / Self Care | Payer: BC Managed Care – PPO | Source: Ambulatory Visit

## 2021-06-25 DIAGNOSIS — Z1231 Encounter for screening mammogram for malignant neoplasm of breast: Secondary | ICD-10-CM

## 2021-06-25 LAB — HM MAMMOGRAPHY

## 2021-06-28 ENCOUNTER — Encounter: Payer: Self-pay | Admitting: Internal Medicine

## 2021-07-29 ENCOUNTER — Ambulatory Visit (AMBULATORY_SURGERY_CENTER): Payer: BC Managed Care – PPO | Admitting: *Deleted

## 2021-07-29 ENCOUNTER — Other Ambulatory Visit: Payer: Self-pay

## 2021-07-29 VITALS — Ht 67.75 in | Wt 180.0 lb

## 2021-07-29 DIAGNOSIS — Z1211 Encounter for screening for malignant neoplasm of colon: Secondary | ICD-10-CM

## 2021-07-29 MED ORDER — PEG 3350-KCL-NA BICARB-NACL 420 G PO SOLR: 4000.0000 mL | Freq: Once | ORAL | 0 refills | Status: AC

## 2021-07-29 NOTE — Progress Notes (Signed)

## 2021-08-09 ENCOUNTER — Encounter: Payer: Self-pay | Admitting: Gastroenterology

## 2021-08-12 ENCOUNTER — Other Ambulatory Visit: Payer: Self-pay

## 2021-08-12 ENCOUNTER — Ambulatory Visit (AMBULATORY_SURGERY_CENTER): Payer: BC Managed Care – PPO | Admitting: Gastroenterology

## 2021-08-12 ENCOUNTER — Encounter: Payer: Self-pay | Admitting: Gastroenterology

## 2021-08-12 VITALS — BP 111/78 | HR 61 | Temp 96.8°F | Resp 14 | Ht 67.75 in | Wt 180.0 lb

## 2021-08-12 DIAGNOSIS — D175 Benign lipomatous neoplasm of intra-abdominal organs: Secondary | ICD-10-CM

## 2021-08-12 DIAGNOSIS — D123 Benign neoplasm of transverse colon: Secondary | ICD-10-CM | POA: Diagnosis not present

## 2021-08-12 DIAGNOSIS — K5730 Diverticulosis of large intestine without perforation or abscess without bleeding: Secondary | ICD-10-CM | POA: Diagnosis not present

## 2021-08-12 DIAGNOSIS — R195 Other fecal abnormalities: Secondary | ICD-10-CM | POA: Diagnosis not present

## 2021-08-12 DIAGNOSIS — Z1211 Encounter for screening for malignant neoplasm of colon: Secondary | ICD-10-CM | POA: Diagnosis not present

## 2021-08-12 LAB — HM COLONOSCOPY

## 2021-08-12 MED ORDER — SODIUM CHLORIDE 0.9 % IV SOLN: 500.0000 mL | INTRAVENOUS | Status: DC

## 2021-08-12 NOTE — Progress Notes (Signed)
Called to room to assist during endoscopic procedure.  Patient ID and intended procedure confirmed with present staff. Received instructions for my participation in the procedure from the performing physician.  

## 2021-08-12 NOTE — Progress Notes (Signed)
Pt's states no medical or surgical changes since previsit or office visit. 

## 2021-08-12 NOTE — Progress Notes (Signed)
HPI: This is a woman with cologuard+ stool   ROS: complete GI ROS as described in HPI, all other review negative.  Constitutional:  No unintentional weight loss   Past Medical History:  Diagnosis Date   Hyperlipidemia     Past Surgical History:  Procedure Laterality Date   BREAST EXCISIONAL BIOPSY Right     Current Outpatient Medications  Medication Sig Dispense Refill   atorvastatin (LIPITOR) 20 MG tablet TAKE 1 TABLET(20 MG) BY MOUTH DAILY 90 tablet 3   augmented betamethasone dipropionate (DIPROLENE-AF) 0.05 % cream 3 times/day as needed-between meals & bedtime. (Patient not taking: Reported on 07/29/2021)     ibuprofen (ADVIL) 200 MG tablet Take 200 mg by mouth every 6 (six) hours as needed.      Multiple Vitamin (MULTIVITAMIN) tablet Take 1 tablet by mouth daily.     Current Facility-Administered Medications  Medication Dose Route Frequency Provider Last Rate Last Admin   0.9 %  sodium chloride infusion  500 mL Intravenous Continuous Milus Banister, MD        Allergies as of 08/12/2021 - Review Complete 08/12/2021  Allergen Reaction Noted   Penicillins  02/24/2011    Family History  Problem Relation Age of Onset   Diabetes Father    Hypertension Father    Heart disease Father 12       MI   Diabetes Paternal Aunt    Hypertension Paternal Uncle    Stroke Paternal Uncle    Diabetes Paternal Grandfather    Breast cancer Neg Hx    Colon cancer Neg Hx    Colon polyps Neg Hx    Esophageal cancer Neg Hx    Rectal cancer Neg Hx    Stomach cancer Neg Hx     Social History   Socioeconomic History   Marital status: Married    Spouse name: Not on file   Number of children: Not on file   Years of education: Not on file   Highest education level: Not on file  Occupational History   Not on file  Tobacco Use   Smoking status: Never   Smokeless tobacco: Never  Vaping Use   Vaping Use: Never used  Substance and Sexual Activity   Alcohol use: Yes    Comment:  rare   Drug use: No   Sexual activity: Yes    Birth control/protection: Surgical  Other Topics Concern   Not on file  Social History Narrative   Not on file   Social Determinants of Health   Financial Resource Strain: Not on file  Food Insecurity: Not on file  Transportation Needs: Not on file  Physical Activity: Not on file  Stress: Not on file  Social Connections: Not on file  Intimate Partner Violence: Not on file     Physical Exam: BP 115/79    Pulse 75    Temp (!) 96.8 F (36 C)    Ht 5' 7.75" (1.721 m)    Wt 180 lb (81.6 kg)    LMP 03/06/2018    SpO2 100%    BMI 27.57 kg/m  Constitutional: generally well-appearing Psychiatric: alert and oriented x3 Lungs: CTA bilaterally Heart: no MCR  Assessment and plan: 56 y.o. female with cologuard + stool  Colonoscopy today  Care is appropriate for the ambulatory setting.  Owens Loffler, MD [facility] 08/12/2021, 10:20 AM

## 2021-08-12 NOTE — Patient Instructions (Signed)
Information on polyps given to you today.  Await pathology results.  Resume previous diet and medications.  YOU HAD AN ENDOSCOPIC PROCEDURE TODAY AT THE [facility]:   Refer to the procedure report that was given to you for any specific questions about what was found during the examination.  If the procedure report does not answer your questions, please call your gastroenterologist to clarify.  If you requested that your care partner not be given the details of your procedure findings, then the procedure report has been included in a sealed envelope for you to review at your convenience later.  YOU SHOULD EXPECT: Some feelings of bloating in the abdomen. Passage of more gas than usual.  Walking can help get rid of the air that was put into your GI tract during the procedure and reduce the bloating. If you had a lower endoscopy (such as a colonoscopy or flexible sigmoidoscopy) you may notice spotting of blood in your stool or on the toilet paper. If you underwent a bowel prep for your procedure, you may not have a normal bowel movement for a few days.  Please Note:  You might notice some irritation and congestion in your nose or some drainage.  This is from the oxygen used during your procedure.  There is no need for concern and it should clear up in a day or so.  SYMPTOMS TO REPORT IMMEDIATELY:   Following lower endoscopy (colonoscopy or flexible sigmoidoscopy):  Excessive amounts of blood in the stool  Significant tenderness or worsening of abdominal pains  Swelling of the abdomen that is new, acute  Fever of 100F or higher   For urgent or emergent issues, a gastroenterologist can be reached at any hour by calling [phone]. Do not use MyChart messaging for urgent concerns.    DIET:  We do recommend a small meal at first, but then you may proceed to your regular diet.  Drink plenty of fluids but you should avoid alcoholic beverages for 24 hours.  ACTIVITY:  You should  plan to take it easy for the rest of today and you should NOT DRIVE or use heavy machinery until tomorrow (because of the sedation medicines used during the test).    FOLLOW UP: Our staff will call the number listed on your records 48-72 hours following your procedure to check on you and address any questions or concerns that you may have regarding the information given to you following your procedure. If we do not reach you, we will leave a message.  We will attempt to reach you two times.  During this call, we will ask if you have developed any symptoms of COVID 19. If you develop any symptoms (ie: fever, flu-like symptoms, shortness of breath, cough etc.) before then, please call [phone].  If you test positive for Covid 19 in the 2 weeks post procedure, please call and report this information to us.    If any biopsies were taken you will be contacted by phone or by letter within the next 1-3 weeks.  Please call us at [phone] if you have not heard about the biopsies in 3 weeks.    SIGNATURES/CONFIDENTIALITY: You and/or your care partner have signed paperwork which will be entered into your electronic medical record.  These signatures attest to the fact that that the information above on your After Visit Summary has been reviewed and is understood.  Full responsibility of the confidentiality of this discharge information lies with you and/or your care-partner. 

## 2021-08-12 NOTE — Op Note (Signed)
[facility] Patient Name: Misty Clarke Procedure Date: 08/12/2021 10:15 AM MRN: [id number] Endoscopist: Milus Banister , MD Age: 56 Referring MD:  Date of Birth: [birth year] Gender: Female Account #: [id number] Procedure:                Colonoscopy Indications:              Positive Cologuard test Medicines:                Monitored Anesthesia Care Procedure:                Pre-Anesthesia Assessment:                           - Prior to the procedure, a History and Physical                            was performed, and patient medications and                            allergies were reviewed. The patient's tolerance of                            previous anesthesia was also reviewed. The risks                            and benefits of the procedure and the sedation                            options and risks were discussed with the patient.                            All questions were answered, and informed consent                            was obtained. Prior Anticoagulants: The patient has                            taken no previous anticoagulant or antiplatelet                            agents. ASA Grade Assessment: II - A patient with                            mild systemic disease. After reviewing the risks                            and benefits, the patient was deemed in                            satisfactory condition to undergo the procedure.                           After obtaining informed consent, the colonoscope  was passed under direct vision. Throughout the                            procedure, the patient's blood pressure, pulse, and                            oxygen saturations were monitored continuously. The                            Olympus CF-[id number]) Colonoscope was                            introduced through the anus and advanced to the the                            cecum, identified by appendiceal orifice  and                            ileocecal valve. The colonoscopy was performed                            without difficulty. The patient tolerated the                            procedure well. The quality of the bowel                            preparation was good. The ileocecal valve, and                            rectum were photographed. Scope In: 10:28:16 AM Scope Out: 10:42:51 AM Scope Withdrawal Time: 0 hours 11 minutes 12 seconds  Total Procedure Duration: 0 hours 14 minutes 35 seconds  Findings:                 A 3 mm polyp was found in the transverse colon. The                            polyp was sessile. The polyp was removed with a                            cold snare. Resection and retrieval were complete.                           Multiple small and large-mouthed diverticula were                            found in the left colon.                           Classic appearing small lipoma on the proximal                            transverse colon, 1.5cm across.  The exam was otherwise without abnormality on                            direct and retroflexion views. Complications:            No immediate complications. Estimated blood loss:                            None. Estimated Blood Loss:     Estimated blood loss: none. Impression:               - One 3 mm polyp in the transverse colon, removed                            with a cold snare. Resected and retrieved.                           - Incidental transverse colon lipoma.                           - Diverticulosis in the left colon.                           - The examination was otherwise normal on direct                            and retroflexion views. Recommendation:           - Patient has a contact number available for                            emergencies. The signs and symptoms of potential                            delayed complications were discussed with the                             patient. Return to normal activities tomorrow.                            Written discharge instructions were provided to the                            patient.                           - Resume previous diet.                           - Continue present medications.                           - Await pathology results. Milus Banister, MD 08/12/2021 10:48:38 AM This report has been signed electronically.

## 2021-08-12 NOTE — Progress Notes (Signed)
To pacu, VSS. Report to [email or web address] 

## 2021-08-12 NOTE — Progress Notes (Signed)
Pt's states no medical or surgical changes since previsit or office visit.   Vs by DT in adm

## 2021-08-14 ENCOUNTER — Encounter: Payer: Self-pay | Admitting: Gastroenterology

## 2021-08-14 ENCOUNTER — Telehealth: Payer: Self-pay

## 2021-08-14 NOTE — Telephone Encounter (Signed)
Patient returned call, stated that she is doing well.  

## 2021-08-14 NOTE — Telephone Encounter (Signed)
°  Follow up Call-  Call back number 08/12/2021  Post procedure Call Back phone  # [phone]  Permission to leave phone message Yes  Some recent data might be hidden    2nd follow up call made.  NALM

## 2021-08-14 NOTE — Telephone Encounter (Signed)
Called [phone] and left a message we tried to reach pt for a follow up call. maw

## 2021-09-17 DIAGNOSIS — L301 Dyshidrosis [pompholyx]: Secondary | ICD-10-CM | POA: Diagnosis not present

## 2021-11-13 DIAGNOSIS — H52203 Unspecified astigmatism, bilateral: Secondary | ICD-10-CM | POA: Diagnosis not present

## 2021-11-13 DIAGNOSIS — H524 Presbyopia: Secondary | ICD-10-CM | POA: Diagnosis not present

## 2021-11-13 DIAGNOSIS — H5213 Myopia, bilateral: Secondary | ICD-10-CM | POA: Diagnosis not present

## 2022-01-09 ENCOUNTER — Other Ambulatory Visit: Payer: Self-pay | Admitting: Obstetrics and Gynecology

## 2022-01-09 DIAGNOSIS — Z01419 Encounter for gynecological examination (general) (routine) without abnormal findings: Secondary | ICD-10-CM | POA: Diagnosis not present

## 2022-01-09 DIAGNOSIS — N841 Polyp of cervix uteri: Secondary | ICD-10-CM | POA: Diagnosis not present

## 2022-01-09 DIAGNOSIS — Z8742 Personal history of other diseases of the female genital tract: Secondary | ICD-10-CM | POA: Diagnosis not present

## 2022-03-05 ENCOUNTER — Encounter: Payer: Self-pay | Admitting: Internal Medicine

## 2022-04-21 ENCOUNTER — Encounter: Payer: Self-pay | Admitting: Internal Medicine

## 2022-05-13 ENCOUNTER — Other Ambulatory Visit: Payer: Self-pay | Admitting: Family Medicine

## 2022-05-13 DIAGNOSIS — Z1231 Encounter for screening mammogram for malignant neoplasm of breast: Secondary | ICD-10-CM

## 2022-05-27 ENCOUNTER — Ambulatory Visit: Payer: BC Managed Care – PPO | Admitting: Family Medicine

## 2022-05-27 ENCOUNTER — Encounter: Payer: Self-pay | Admitting: Family Medicine

## 2022-05-27 VITALS — BP 112/80 | HR 72 | Ht 68.0 in | Wt 183.0 lb

## 2022-05-27 DIAGNOSIS — Z0000 Encounter for general adult medical examination without abnormal findings: Secondary | ICD-10-CM | POA: Diagnosis not present

## 2022-05-27 DIAGNOSIS — Z86010 Personal history of colonic polyps: Secondary | ICD-10-CM | POA: Diagnosis not present

## 2022-05-27 DIAGNOSIS — J309 Allergic rhinitis, unspecified: Secondary | ICD-10-CM

## 2022-05-27 DIAGNOSIS — E785 Hyperlipidemia, unspecified: Secondary | ICD-10-CM | POA: Diagnosis not present

## 2022-05-27 DIAGNOSIS — Z23 Encounter for immunization: Secondary | ICD-10-CM

## 2022-05-27 DIAGNOSIS — Z8249 Family history of ischemic heart disease and other diseases of the circulatory system: Secondary | ICD-10-CM

## 2022-05-27 LAB — POCT URINALYSIS DIP (PROADVANTAGE DEVICE)
Bilirubin, UA: NEGATIVE
Blood, UA: NEGATIVE
Glucose, UA: NEGATIVE mg/dL
Ketones, POC UA: NEGATIVE mg/dL
Nitrite, UA: NEGATIVE
Protein Ur, POC: NEGATIVE mg/dL
Specific Gravity, Urine: 1.025
Urobilinogen, Ur: 0.2
pH, UA: 6 (ref 5.0–8.0)

## 2022-05-27 NOTE — Progress Notes (Signed)
Complete physical exam  Patient: Misty Clarke   DOB: [birth year]   56 y.o. Female  MRN: [id number]  Subjective:    Chief Complaint  Patient presents with   Annual Exam    Fasting annual exam, no new concerns. Has form for work that needs filled out and faxed after labs come back tomorrow.     Misty Clarke is a 56 y.o. female who presents today for a complete physical exam. She reports consuming a general diet.  Walking and bowling are her exercise.   She generally feels well. She reports sleeping fairly well. She does see her gynecologist regularly.  She also has a history of colonic polyps and is scheduled for 5-year follow-up on that.  She continues on Lipitor and is having no difficulty with that.  Is also taking a good multivitamin.  Her allergies are under good control.   Most recent fall risk assessment:    05/27/2022    9:12 AM  Fall Risk   Falls in the past year? 0  Number falls in past yr: 0  Injury with Fall? 0  Risk for fall due to : No Fall Risks  Follow up Falls evaluation completed     Most recent depression screenings:    05/09/2021    8:36 AM 05/08/2020    8:54 AM  PHQ 2/9 Scores  PHQ - 2 Score 0 0    Vision:Within last year    Patient Care Team: Denita Lung, MD as PCP - General (Family Medicine)   Outpatient Medications Prior to Visit  Medication Sig Note   atorvastatin (LIPITOR) 20 MG tablet TAKE 1 TABLET(20 MG) BY MOUTH DAILY    Multiple Vitamin (MULTIVITAMIN) tablet Take 1 tablet by mouth daily.    augmented betamethasone dipropionate (DIPROLENE-AF) 0.05 % cream 3 times/day as needed-between meals & bedtime. (Patient not taking: Reported on 07/29/2021) 05/27/2022: prn   ibuprofen (ADVIL) 200 MG tablet Take 200 mg by mouth every 6 (six) hours as needed.  (Patient not taking: Reported on 05/27/2022) 05/27/2022: prn   No facility-administered medications prior to visit.    Review of Systems  All other systems reviewed and are  negative.         Objective:     BP 112/80   Pulse 72   Ht '5\' 8"'$  (1.727 m)   Wt 183 lb (83 kg)   LMP 03/06/2018   BMI 27.83 kg/m    Physical Exam   Alert and in no distress. Tympanic membranes and canals are normal. Pharyngeal area is normal. Neck is supple without adenopathy or thyromegaly. Cardiac exam shows a regular sinus rhythm without murmurs or gallops. Lungs are clear to auscultation.      Assessment & Plan:    Routine general medical examination at a health care facility - Plan: CBC with Differential/Platelet, Comprehensive metabolic panel, Lipid panel  Need for influenza vaccination - Plan: Flu Vaccine QUAD 6+ mos PF IM (Fluarix Quad PF)  Need for COVID-19 vaccine - Plan: [place] Fall 2023 Covid-19 Vaccine 66yr and older  Annual physical exam - Plan: POCT Urinalysis DIP (Proadvantage Device)  History of colonic polyps - Tubular adenoma  Hyperlipidemia, unspecified hyperlipidemia type - Plan: Lipid panel  Family history of heart disease in female family member before age 10547 Allergic rhinitis, mild  Immunization History  Administered Date(s) Administered   COVID-19, mRNA, vaccine(Comirnaty)12 years and older 05/27/2022   DTaP 05/11/2003   Influenza Split 07/23/2011   Influenza,inj,Quad PF,6+  Mos 04/29/2018, 05/04/2019, 05/08/2020, 05/09/2021, 05/27/2022   Janssen (J&J) SARS-COV-2 Vaccination 10/29/2019   Moderna Covid-19 Vaccine Bivalent Booster 37yr & up 05/09/2021   Moderna Sars-Covid-2 Vaccination 05/08/2020   Tdap 04/29/2018   Unspecified SARS-COV-2 Vaccination 10/29/2019    Health Maintenance  Topic Date Due   Zoster Vaccines- Shingrix (1 of 2) Never done   PAP SMEAR-Modifier  12/14/2021   COVID-19 Vaccine (5 - 2023-24 season) 02/28/2022   MAMMOGRAM  06/26/2023   Fecal DNA (Cologuard)  06/05/2024   INFLUENZA VACCINE  Completed   Hepatitis C Screening  Completed   HIV Screening  Completed   HPV VACCINES  Aged Out    Discussed health  benefits of physical activity, and encouraged her to engage in regular exercise appropriate for her age and condition.  Continue on Lipitor and take good care of yourself.  Problem List Items Addressed This Visit     Allergic rhinitis, mild   Family history of heart disease in female family member before age 55  Hyperlipidemia   Relevant Orders   Lipid panel   Other Visit Diagnoses     Routine general medical examination at a health care facility    -  Primary   Relevant Orders   CBC with Differential/Platelet   Comprehensive metabolic panel   Lipid panel   Need for influenza vaccination       Relevant Orders   Flu Vaccine QUAD 6+ mos PF IM (Fluarix Quad PF) (Completed)   Need for COVID-19 vaccine       Relevant Orders   Pfizer Fall 2023 Covid-19 Vaccine 141yrand older (Completed)   Annual physical exam       Relevant Orders   POCT Urinalysis DIP (Proadvantage Device)   History of colonic polyps       Tubular adenoma          JoJill AlexandersMD

## 2022-05-28 ENCOUNTER — Other Ambulatory Visit: Payer: Self-pay | Admitting: Family Medicine

## 2022-05-28 ENCOUNTER — Other Ambulatory Visit: Payer: Self-pay

## 2022-05-28 DIAGNOSIS — E785 Hyperlipidemia, unspecified: Secondary | ICD-10-CM

## 2022-05-28 LAB — CBC WITH DIFFERENTIAL/PLATELET
Basophils Absolute: 0.1 10*3/uL (ref 0.0–0.2)
Basos: 1 %
EOS (ABSOLUTE): 0.1 10*3/uL (ref 0.0–0.4)
Eos: 2 %
Hematocrit: 38.2 % (ref 34.0–46.6)
Hemoglobin: 12.3 g/dL (ref 11.1–15.9)
Immature Grans (Abs): 0 10*3/uL (ref 0.0–0.1)
Immature Granulocytes: 0 %
Lymphocytes Absolute: 2 10*3/uL (ref 0.7–3.1)
Lymphs: 41 %
MCH: 27.7 pg (ref 26.6–33.0)
MCHC: 32.2 g/dL (ref 31.5–35.7)
MCV: 86 fL (ref 79–97)
Monocytes Absolute: 0.4 10*3/uL (ref 0.1–0.9)
Monocytes: 9 %
Neutrophils Absolute: 2.4 10*3/uL (ref 1.4–7.0)
Neutrophils: 47 %
Platelets: 316 10*3/uL (ref 150–450)
RBC: 4.44 x10E6/uL (ref 3.77–5.28)
RDW: 13.3 % (ref 11.7–15.4)
WBC: 4.9 10*3/uL (ref 3.4–10.8)

## 2022-05-28 LAB — COMPREHENSIVE METABOLIC PANEL
ALT: 40 IU/L — ABNORMAL HIGH (ref 0–32)
AST: 36 IU/L (ref 0–40)
Albumin/Globulin Ratio: 2 (ref 1.2–2.2)
Albumin: 4.9 g/dL (ref 3.8–4.9)
Alkaline Phosphatase: 126 IU/L — ABNORMAL HIGH (ref 44–121)
BUN/Creatinine Ratio: 14 (ref 9–23)
BUN: 12 mg/dL (ref 6–24)
Bilirubin Total: 0.7 mg/dL (ref 0.0–1.2)
CO2: 24 mmol/L (ref 20–29)
Calcium: 9.8 mg/dL (ref 8.7–10.2)
Chloride: 103 mmol/L (ref 96–106)
Creatinine, Ser: 0.88 mg/dL (ref 0.57–1.00)
Globulin, Total: 2.4 g/dL (ref 1.5–4.5)
Glucose: 85 mg/dL (ref 70–99)
Potassium: 4.5 mmol/L (ref 3.5–5.2)
Sodium: 141 mmol/L (ref 134–144)
Total Protein: 7.3 g/dL (ref 6.0–8.5)
eGFR: 78 mL/min/{1.73_m2} (ref >59)

## 2022-05-28 LAB — LIPID PANEL
Chol/HDL Ratio: 3 ratio (ref 0.0–4.4)
Cholesterol, Total: 241 mg/dL — ABNORMAL HIGH (ref 100–199)
HDL: 80 mg/dL (ref >39)
LDL Chol Calc (NIH): 150 mg/dL — ABNORMAL HIGH (ref 0–99)
Triglycerides: 67 mg/dL (ref 0–149)
VLDL Cholesterol Cal: 11 mg/dL (ref 5–40)

## 2022-05-28 MED ORDER — ATORVASTATIN CALCIUM 20 MG PO TABS: ORAL_TABLET | ORAL | 3 refills | Status: DC

## 2022-06-26 ENCOUNTER — Ambulatory Visit: Payer: BC Managed Care – PPO

## 2022-06-26 ENCOUNTER — Ambulatory Visit
Admission: RE | Admit: 2022-06-26 | Discharge: 2022-06-26 | Disposition: A | Discharge status: Home / Self Care | Payer: BC Managed Care – PPO | Source: Ambulatory Visit | Attending: Family Medicine | Admitting: Family Medicine

## 2022-06-26 DIAGNOSIS — Z1231 Encounter for screening mammogram for malignant neoplasm of breast: Secondary | ICD-10-CM

## 2022-06-26 LAB — HM MAMMOGRAPHY

## 2023-01-21 DIAGNOSIS — H524 Presbyopia: Secondary | ICD-10-CM | POA: Diagnosis not present

## 2023-01-21 DIAGNOSIS — H5213 Myopia, bilateral: Secondary | ICD-10-CM | POA: Diagnosis not present

## 2023-01-21 DIAGNOSIS — H43813 Vitreous degeneration, bilateral: Secondary | ICD-10-CM | POA: Diagnosis not present

## 2023-02-07 ENCOUNTER — Inpatient Hospital Stay (HOSPITAL_COMMUNITY)
Admission: EM | Disposition: A | Discharge status: Home / Self Care | Payer: Self-pay | Source: Home / Self Care | Attending: Neurosurgery

## 2023-02-07 ENCOUNTER — Other Ambulatory Visit: Payer: Self-pay

## 2023-02-07 ENCOUNTER — Emergency Department (HOSPITAL_BASED_OUTPATIENT_CLINIC_OR_DEPARTMENT_OTHER): Payer: BC Managed Care – PPO

## 2023-02-07 ENCOUNTER — Emergency Department (HOSPITAL_COMMUNITY): Payer: BC Managed Care – PPO | Admitting: Anesthesiology

## 2023-02-07 ENCOUNTER — Encounter (HOSPITAL_BASED_OUTPATIENT_CLINIC_OR_DEPARTMENT_OTHER): Payer: Self-pay

## 2023-02-07 ENCOUNTER — Inpatient Hospital Stay (HOSPITAL_BASED_OUTPATIENT_CLINIC_OR_DEPARTMENT_OTHER)
Admission: EM | Admit: 2023-02-07 | Discharge: 2023-02-17 | DRG: 022 | Disposition: A | Discharge status: Home / Self Care | Payer: BC Managed Care – PPO | Attending: Neurosurgery | Admitting: Neurosurgery

## 2023-02-07 ENCOUNTER — Emergency Department (HOSPITAL_COMMUNITY): Payer: BC Managed Care – PPO

## 2023-02-07 DIAGNOSIS — R930 Abnormal findings on diagnostic imaging of skull and head, not elsewhere classified: Secondary | ICD-10-CM | POA: Diagnosis not present

## 2023-02-07 DIAGNOSIS — J309 Allergic rhinitis, unspecified: Secondary | ICD-10-CM | POA: Diagnosis not present

## 2023-02-07 DIAGNOSIS — I6002 Nontraumatic subarachnoid hemorrhage from left carotid siphon and bifurcation: Secondary | ICD-10-CM | POA: Diagnosis not present

## 2023-02-07 DIAGNOSIS — E785 Hyperlipidemia, unspecified: Secondary | ICD-10-CM | POA: Diagnosis not present

## 2023-02-07 DIAGNOSIS — Z79899 Other long term (current) drug therapy: Secondary | ICD-10-CM | POA: Diagnosis not present

## 2023-02-07 DIAGNOSIS — I671 Cerebral aneurysm, nonruptured: Secondary | ICD-10-CM | POA: Diagnosis not present

## 2023-02-07 DIAGNOSIS — R739 Hyperglycemia, unspecified: Secondary | ICD-10-CM | POA: Diagnosis not present

## 2023-02-07 DIAGNOSIS — R9431 Abnormal electrocardiogram [ECG] [EKG]: Secondary | ICD-10-CM | POA: Diagnosis not present

## 2023-02-07 DIAGNOSIS — D72828 Other elevated white blood cell count: Secondary | ICD-10-CM | POA: Diagnosis present

## 2023-02-07 DIAGNOSIS — R519 Headache, unspecified: Secondary | ICD-10-CM | POA: Diagnosis not present

## 2023-02-07 DIAGNOSIS — Z880 Allergy status to penicillin: Secondary | ICD-10-CM | POA: Diagnosis not present

## 2023-02-07 DIAGNOSIS — I609 Nontraumatic subarachnoid hemorrhage, unspecified: Principal | ICD-10-CM

## 2023-02-07 HISTORY — PX: IR ANGIOGRAM FOLLOW UP STUDY: IMG697

## 2023-02-07 HISTORY — PX: RADIOLOGY WITH ANESTHESIA: SHX6223

## 2023-02-07 HISTORY — PX: IR ANGIO INTRA EXTRACRAN SEL INTERNAL CAROTID BILAT MOD SED: IMG5363

## 2023-02-07 HISTORY — PX: IR ANGIO INTRA EXTRACRAN SEL INTERNAL CAROTID UNI R MOD SED: IMG5362

## 2023-02-07 HISTORY — PX: IR TRANSCATH/EMBOLIZ: IMG695

## 2023-02-07 HISTORY — PX: IR NEURO EACH ADD'L AFTER BASIC UNI LEFT (MS): IMG5373

## 2023-02-07 HISTORY — PX: IR ANGIO VERTEBRAL SEL VERTEBRAL UNI L MOD SED: IMG5367

## 2023-02-07 LAB — CBC WITH DIFFERENTIAL/PLATELET
Abs Immature Granulocytes: 0.05 10*3/uL (ref 0.00–0.07)
Basophils Absolute: 0.1 10*3/uL (ref 0.0–0.1)
Basophils Relative: 1 %
Eosinophils Absolute: 0.1 10*3/uL (ref 0.0–0.5)
Eosinophils Relative: 1 %
HCT: 36.8 % (ref 36.0–46.0)
Hemoglobin: 12.4 g/dL (ref 12.0–15.0)
Immature Granulocytes: 1 %
Lymphocytes Relative: 17 %
Lymphs Abs: 1.9 10*3/uL (ref 0.7–4.0)
MCH: 29.1 pg (ref 26.0–34.0)
MCHC: 33.7 g/dL (ref 30.0–36.0)
MCV: 86.4 fL (ref 80.0–100.0)
Monocytes Absolute: 0.9 10*3/uL (ref 0.1–1.0)
Monocytes Relative: 9 %
Neutro Abs: 7.9 10*3/uL — ABNORMAL HIGH (ref 1.7–7.7)
Neutrophils Relative %: 71 %
Platelets: 253 10*3/uL (ref 150–400)
RBC: 4.26 MIL/uL (ref 3.87–5.11)
RDW: 14.2 % (ref 11.5–15.5)
WBC: 10.8 10*3/uL — ABNORMAL HIGH (ref 4.0–10.5)
nRBC: 0 % (ref 0.0–0.2)

## 2023-02-07 LAB — BASIC METABOLIC PANEL
Anion gap: 16 — ABNORMAL HIGH (ref 5–15)
BUN: 17 mg/dL (ref 6–20)
CO2: 18 mmol/L — ABNORMAL LOW (ref 22–32)
Calcium: 9.4 mg/dL (ref 8.9–10.3)
Chloride: 105 mmol/L (ref 98–111)
Creatinine, Ser: 1.24 mg/dL — ABNORMAL HIGH (ref 0.44–1.00)
GFR, Estimated: 51 mL/min — ABNORMAL LOW (ref >60)
Glucose, Bld: 119 mg/dL — ABNORMAL HIGH (ref 70–99)
Potassium: 3.5 mmol/L (ref 3.5–5.1)
Sodium: 139 mmol/L (ref 135–145)

## 2023-02-07 LAB — TROPONIN I (HIGH SENSITIVITY)
Troponin I (High Sensitivity): 2 ng/L (ref <18)
Troponin I (High Sensitivity): 15 ng/L (ref <18)

## 2023-02-07 LAB — PROTIME-INR
INR: 1 (ref 0.8–1.2)
Prothrombin Time: 13.4 seconds (ref 11.4–15.2)

## 2023-02-07 LAB — MRSA NEXT GEN BY PCR, NASAL: MRSA by PCR Next Gen: NOT DETECTED

## 2023-02-07 LAB — HIV ANTIBODY (ROUTINE TESTING W REFLEX): HIV Screen 4th Generation wRfx: NONREACTIVE

## 2023-02-07 LAB — APTT: aPTT: 26 seconds (ref 24–36)

## 2023-02-07 SURGERY — IR WITH ANESTHESIA
Anesthesia: General

## 2023-02-07 MED ORDER — OXYCODONE HCL 5 MG/5ML PO SOLN: 5.0000 mg | Freq: Once | ORAL | Status: DC | PRN

## 2023-02-07 MED ORDER — DOCUSATE SODIUM 100 MG PO CAPS
100.0000 mg | ORAL_CAPSULE | Freq: Two times a day (BID) | ORAL | Status: DC
  Administered 2023-02-08 – 2023-02-17 (×18): 100 mg via ORAL
  Filled 2023-02-07 (×17): qty 1

## 2023-02-07 MED ORDER — SODIUM CHLORIDE 0.9 % IV SOLN: INTRAVENOUS | Status: DC | PRN

## 2023-02-07 MED ORDER — NIMODIPINE 30 MG PO CAPS
60.0000 mg | ORAL_CAPSULE | ORAL | Status: DC
  Administered 2023-02-07 – 2023-02-17 (×59): 60 mg via ORAL
  Filled 2023-02-07 (×53): qty 2

## 2023-02-07 MED ORDER — ACETAMINOPHEN 500 MG PO TABS
1000.0000 mg | ORAL_TABLET | Freq: Once | ORAL | Status: AC
  Administered 2023-02-07: 1000 mg via ORAL
  Filled 2023-02-07: qty 2

## 2023-02-07 MED ORDER — DIPHENHYDRAMINE HCL 50 MG/ML IJ SOLN
25.0000 mg | Freq: Once | INTRAMUSCULAR | Status: AC
  Administered 2023-02-07: 25 mg via INTRAVENOUS
  Filled 2023-02-07: qty 1

## 2023-02-07 MED ORDER — CHLORHEXIDINE GLUCONATE CLOTH 2 % EX PADS
6.0000 | MEDICATED_PAD | Freq: Every day | CUTANEOUS | Status: DC
  Administered 2023-02-07 – 2023-02-16 (×9): 6 via TOPICAL

## 2023-02-07 MED ORDER — ONDANSETRON 4 MG PO TBDP: 4.0000 mg | ORAL_TABLET | Freq: Four times a day (QID) | ORAL | Status: DC | PRN

## 2023-02-07 MED ORDER — LIDOCAINE 2% (20 MG/ML) 5 ML SYRINGE
INTRAMUSCULAR | Status: DC | PRN
  Administered 2023-02-07: 60 mg via INTRAVENOUS

## 2023-02-07 MED ORDER — AMISULPRIDE (ANTIEMETIC) 5 MG/2ML IV SOLN: 10.0000 mg | Freq: Once | INTRAVENOUS | Status: DC | PRN

## 2023-02-07 MED ORDER — IOHEXOL 300 MG/ML  SOLN
150.0000 mL | Freq: Once | INTRAMUSCULAR | Status: AC | PRN
  Administered 2023-02-07: 70 mL via INTRA_ARTERIAL

## 2023-02-07 MED ORDER — ACETAMINOPHEN 160 MG/5ML PO SOLN: 650.0000 mg | ORAL | Status: DC | PRN

## 2023-02-07 MED ORDER — METOCLOPRAMIDE HCL 5 MG/ML IJ SOLN
5.0000 mg | Freq: Once | INTRAMUSCULAR | Status: AC
  Administered 2023-02-07: 5 mg via INTRAVENOUS
  Filled 2023-02-07: qty 2

## 2023-02-07 MED ORDER — SODIUM CHLORIDE 0.9 % IV BOLUS
1000.0000 mL | Freq: Once | INTRAVENOUS | Status: AC
  Administered 2023-02-07: 1000 mL via INTRAVENOUS

## 2023-02-07 MED ORDER — ORAL CARE MOUTH RINSE: 15.0000 mL | OROMUCOSAL | Status: DC | PRN

## 2023-02-07 MED ORDER — ADULT MULTIVITAMIN W/MINERALS CH
1.0000 | ORAL_TABLET | Freq: Every day | ORAL | Status: DC
  Administered 2023-02-08 – 2023-02-17 (×10): 1 via ORAL
  Filled 2023-02-07 (×10): qty 1

## 2023-02-07 MED ORDER — FENTANYL CITRATE (PF) 100 MCG/2ML IJ SOLN: 25.0000 ug | INTRAMUSCULAR | Status: DC | PRN

## 2023-02-07 MED ORDER — ONDANSETRON HCL 4 MG/2ML IJ SOLN
4.0000 mg | Freq: Four times a day (QID) | INTRAMUSCULAR | Status: DC | PRN
  Filled 2023-02-07 (×2): qty 2

## 2023-02-07 MED ORDER — ACETAMINOPHEN 325 MG PO TABS
650.0000 mg | ORAL_TABLET | ORAL | Status: DC | PRN
  Administered 2023-02-08 – 2023-02-17 (×46): 650 mg via ORAL
  Filled 2023-02-07 (×46): qty 2

## 2023-02-07 MED ORDER — SODIUM CHLORIDE 0.9 % IV SOLN
INTRAVENOUS | Status: DC | PRN
Start: 2023-02-07 — End: 2023-02-07

## 2023-02-07 MED ORDER — PHENYLEPHRINE 80 MCG/ML (10ML) SYRINGE FOR IV PUSH (FOR BLOOD PRESSURE SUPPORT)
PREFILLED_SYRINGE | INTRAVENOUS | Status: DC | PRN
  Administered 2023-02-07: 80 ug via INTRAVENOUS

## 2023-02-07 MED ORDER — PHENYLEPHRINE HCL-NACL 20-0.9 MG/250ML-% IV SOLN
INTRAVENOUS | Status: DC | PRN
  Administered 2023-02-07: 30 ug/min via INTRAVENOUS

## 2023-02-07 MED ORDER — NIMODIPINE 6 MG/ML PO SOLN
60.0000 mg | ORAL | Status: DC
  Filled 2023-02-07: qty 10

## 2023-02-07 MED ORDER — ATORVASTATIN CALCIUM 10 MG PO TABS
20.0000 mg | ORAL_TABLET | Freq: Every day | ORAL | Status: DC
  Administered 2023-02-08 – 2023-02-17 (×10): 20 mg via ORAL
  Filled 2023-02-07 (×10): qty 2

## 2023-02-07 MED ORDER — ACETAMINOPHEN 650 MG RE SUPP: 650.0000 mg | RECTAL | Status: DC | PRN

## 2023-02-07 MED ORDER — SODIUM CHLORIDE 0.9 % IV SOLN: INTRAVENOUS | Status: DC

## 2023-02-07 MED ORDER — STROKE: EARLY STAGES OF RECOVERY BOOK
Freq: Once | Status: AC
  Filled 2023-02-07: qty 1

## 2023-02-07 MED ORDER — DEXAMETHASONE SODIUM PHOSPHATE 10 MG/ML IJ SOLN
INTRAMUSCULAR | Status: DC | PRN
  Administered 2023-02-07: 10 mg via INTRAVENOUS

## 2023-02-07 MED ORDER — OXYCODONE HCL 5 MG PO TABS: 5.0000 mg | ORAL_TABLET | Freq: Once | ORAL | Status: DC | PRN

## 2023-02-07 MED ORDER — PANTOPRAZOLE SODIUM 40 MG PO TBEC
40.0000 mg | DELAYED_RELEASE_TABLET | Freq: Every day | ORAL | Status: DC
  Administered 2023-02-07 – 2023-02-17 (×11): 40 mg via ORAL
  Filled 2023-02-07 (×11): qty 1

## 2023-02-07 MED ORDER — ONDANSETRON HCL 4 MG/2ML IJ SOLN
4.0000 mg | Freq: Once | INTRAMUSCULAR | Status: AC
  Administered 2023-02-07: 4 mg via INTRAVENOUS
  Filled 2023-02-07: qty 2

## 2023-02-07 MED ORDER — MORPHINE SULFATE (PF) 2 MG/ML IV SOLN
1.0000 mg | INTRAVENOUS | Status: DC | PRN
  Filled 2023-02-07: qty 1

## 2023-02-07 MED ORDER — ROCURONIUM BROMIDE 10 MG/ML (PF) SYRINGE
PREFILLED_SYRINGE | INTRAVENOUS | Status: DC | PRN
  Administered 2023-02-07: 50 mg via INTRAVENOUS

## 2023-02-07 MED ORDER — PANTOPRAZOLE SODIUM 40 MG IV SOLR: 40.0000 mg | Freq: Every day | INTRAVENOUS | Status: DC

## 2023-02-07 MED ORDER — SUCCINYLCHOLINE CHLORIDE 200 MG/10ML IV SOSY
PREFILLED_SYRINGE | INTRAVENOUS | Status: DC | PRN
  Administered 2023-02-07: 120 mg via INTRAVENOUS

## 2023-02-07 MED ORDER — CLEVIDIPINE BUTYRATE 0.5 MG/ML IV EMUL
0.0000 mg/h | INTRAVENOUS | Status: DC
  Administered 2023-02-07: 2 mg/h via INTRAVENOUS
  Administered 2023-02-08: 1 mg/h via INTRAVENOUS
  Filled 2023-02-07 (×2): qty 50

## 2023-02-07 MED ORDER — ONDANSETRON HCL 4 MG/2ML IJ SOLN
INTRAMUSCULAR | Status: DC | PRN
  Administered 2023-02-07: 4 mg via INTRAVENOUS

## 2023-02-07 MED ORDER — PROPOFOL 10 MG/ML IV BOLUS
INTRAVENOUS | Status: DC | PRN
Start: 2023-02-07 — End: 2023-02-07
  Administered 2023-02-07: 180 mg via INTRAVENOUS

## 2023-02-07 MED ORDER — FENTANYL CITRATE (PF) 250 MCG/5ML IJ SOLN
INTRAMUSCULAR | Status: DC | PRN
  Administered 2023-02-07: 50 ug via INTRAVENOUS

## 2023-02-07 MED ORDER — SUGAMMADEX SODIUM 200 MG/2ML IV SOLN
INTRAVENOUS | Status: DC | PRN
  Administered 2023-02-07: 200 mg via INTRAVENOUS

## 2023-02-07 MED ORDER — IOHEXOL 350 MG/ML SOLN
75.0000 mL | Freq: Once | INTRAVENOUS | Status: AC | PRN
  Administered 2023-02-07: 75 mL via INTRAVENOUS

## 2023-02-07 NOTE — ED Provider Notes (Signed)
[facility] Provider Note   CSN: [id number] Arrival date & time: 02/07/23  1314     History  Chief Complaint  Patient presents with   Migraine   Loss of Consciousness    Misty Clarke is a 57 y.o. female who presents to the ED with concerns for migraine onset 45 minutes PTA. Pt was washing dishes when she noticed a headache. Notes that it was gradual initially and at a 4/10, however, suddenly increased to being unbearable. Has a history of migraines, however, none this severe. No meds tried PTA. Notes tingling to her bilateral fingers and tongue, nausea, photophobia. Denies chest pain, shortness of breath, abdominal pain, vomiting, numbness, dizziness, lightheadedness, phonophobia.  The history is provided by the patient. No language interpreter was used.       Home Medications Prior to Admission medications   Medication Sig Start Date End Date Taking? Authorizing Provider  atorvastatin (LIPITOR) 20 MG tablet TAKE 1 TABLET BY MOUTH EVERY DAY 05/28/22   [name], MD  augmented betamethasone dipropionate (DIPROLENE-AF) 0.05 % cream 3 times/day as needed-between meals & bedtime. Patient not taking: Reported on 07/29/2021    [provider]  ibuprofen (ADVIL) 200 MG tablet Take 200 mg by mouth every 6 (six) hours as needed.  Patient not taking: Reported on 05/27/2022    [provider]  Multiple Vitamin (MULTIVITAMIN) tablet Take 1 tablet by mouth daily.    [provider]      Allergies    Penicillins    Review of Systems   Review of Systems  All other systems reviewed and are negative.   Physical Exam Updated Vital Signs BP (!) 116/51   Pulse 82   Temp (!) 95.1 F (35.1 C) (Tympanic)   Resp 18   Ht 5\' 8"  (1.727 m)   Wt 83.5 kg   LMP 03/06/2018   SpO2 100%   BMI 27.98 kg/m  Physical Exam Vitals and nursing note reviewed.  Constitutional:      General: She is not in acute distress.     Appearance: She is not diaphoretic.  HENT:     Head: Normocephalic and atraumatic.     Mouth/Throat:     Pharynx: No oropharyngeal exudate.  Eyes:     General: No scleral icterus.    Conjunctiva/sclera: Conjunctivae normal.  Cardiovascular:     Rate and Rhythm: Normal rate and regular rhythm.     Pulses: Normal pulses.     Heart sounds: Normal heart sounds.  Pulmonary:     Effort: Pulmonary effort is normal. No respiratory distress.     Breath sounds: Normal breath sounds. No wheezing.  Abdominal:     General: Bowel sounds are normal.     Palpations: Abdomen is soft. There is no mass.     Tenderness: There is no abdominal tenderness. There is no guarding or rebound.  Musculoskeletal:        General: Normal range of motion.     Cervical back: Normal range of motion and neck supple.  Skin:    General: Skin is warm and dry.  Neurological:     Mental Status: She is alert.     Comments: No focal neurological deficits. Negative pronator drift. Strength and sensation intact to BUE and BLE. Pt with head pain with elevation of her RLE. Grip strength 5/5 bilaterally.  Normal finger-nose testing.  Normal heel-to-shin testing.  Cranial nerves II through XII intact.  Psychiatric:  Behavior: Behavior normal.     ED Results / Procedures / Treatments   Labs (all labs ordered are listed, but only abnormal results are displayed) Labs Reviewed  BASIC METABOLIC PANEL - Abnormal; Notable for the following components:      Result Value   CO2 18 (*)    Glucose, Bld 119 (*)    Creatinine, Ser 1.24 (*)    GFR, Estimated 51 (*)    Anion gap 16 (*)    All other components within normal limits  CBC WITH DIFFERENTIAL/PLATELET - Abnormal; Notable for the following components:   WBC 10.8 (*)    All other components within normal limits  CBC WITH DIFFERENTIAL/PLATELET  PREGNANCY, URINE  TROPONIN I (HIGH SENSITIVITY)  TROPONIN I (HIGH SENSITIVITY)    EKG EKG  Interpretation Date/Time:  Saturday February 07 2023 13:43:40 EDT Ventricular Rate:  87 PR Interval:  202 QRS Duration:  77 QT Interval:  387 QTC Calculation: 466 R Axis:   83  Text Interpretation: Sinus rhythm Consider left ventricular hypertrophy Minimal ST elevation, inferior leads Confirmed by Fulton Reek [phone]) on 02/07/2023 1:58:48 PM  Radiology CT ANGIO HEAD NECK W WO CM  Result Date: 02/07/2023 CLINICAL DATA:  Headache EXAM: CT ANGIOGRAPHY HEAD AND NECK WITH AND WITHOUT CONTRAST TECHNIQUE: Multidetector CT imaging of the head and neck was performed using the standard protocol during bolus administration of intravenous contrast. Multiplanar CT image reconstructions and MIPs were obtained to evaluate the vascular anatomy. Carotid stenosis measurements (when applicable) are obtained utilizing NASCET criteria, using the distal internal carotid diameter as the denominator. RADIATION DOSE REDUCTION: This exam was performed according to the departmental dose-optimization program which includes automated exposure control, adjustment of the mA and/or kV according to patient size and/or use of iterative reconstruction technique. CONTRAST:  75mL OMNIPAQUE IOHEXOL 350 MG/ML SOLN COMPARISON:  None Available. FINDINGS: CT HEAD FINDINGS Brain: There is acute subarachnoid hemorrhage throughout the basal cisterns, sylvian fissures, and frontal lobe sulci. There is small volume blood in the fourth ventricle but no hydrocephalus. Background parenchymal volume is normal. The ventricles are normal in size. Gray-white differentiation is preserved, without evidence of acute infarct. There is no solid mass lesion.  There is no midline shift. Vascular: See below. Skull: Normal. Negative for fracture or focal lesion. Sinuses/Orbits: The paranasal sinuses are clear. The globes and orbits are unremarkable. Other: The mastoid air cells and middle ear cavities are clear. Review of the MIP images confirms the above  findings CTA NECK FINDINGS Aortic arch: The imaged aortic arch is normal. The origins of the major branch vessels are patent. The subclavian arteries are patent to the level imaged. Right carotid system: The right common, internal, and external carotid arteries are patent, without hemodynamically significant stenosis or occlusion. There is no evidence of dissection or aneurysm. Left carotid system: The left common, internal, and external carotid arteries are patent, without hemodynamically significant stenosis or occlusion. There is no evidence of dissection or aneurysm. Vertebral arteries: The vertebral arteries are patent, without hemodynamically significant stenosis or occlusion. There is no evidence of dissection or aneurysm. Skeleton: There is no acute osseous abnormality or suspicious osseous lesion. There is no visible canal hematoma. Other neck: The soft tissues are unremarkable. Upper chest: The imaged lung apices are clear. Review of the MIP images confirms the above findings CTA HEAD FINDINGS Anterior circulation: The bilateral intracranial ICAs are patent. The bilateral MCAs and ACAS are patent, without proximal stenosis or occlusion. There is no significant vessel  irregularity to suggest vasospasm. The anterior communicating artery is not definitely seen. There is a 6 mm by 4 mm by 4 mm medially projecting saccular aneurysm arising from the left paraclinoid ICA. Posterior circulation: The bilateral V4 segments are patent. The basilar artery is patent. The major cerebellar arteries appear patent. The bilateral PCAs are patent, without proximal stenosis or occlusion. There is no definite evidence of vasospasm. There is no posterior circulation aneurysm. Venous sinuses: Patent. Anatomic variants: None. Review of the MIP images confirms the above findings IMPRESSION: 1. 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 No hydrocephalus. 2. No definite evidence of vasospasm. Critical Value/emergent results were called by telephone at the time of interpretation on 02/07/2023 at 2:46 pm to provider Fulton Reek MD, who verbally acknowledged these results. Electronically Signed   By: Lesia Hausen M.D.   On: 02/07/2023 14:57    Procedures .Critical Care E&M  Performed by: Karenann Cai, PA-C Critical care provider statement:    Critical care time (minutes):  45   Critical care time was exclusive of:  Separately billable procedures and treating other patients   Critical care was necessary to treat or prevent imminent or life-threatening deterioration of the following conditions:  CNS failure or compromise   Critical care was time spent personally by me on the following activities:  Blood draw for specimens, development of treatment plan with patient or surrogate, evaluation of patient's response to treatment, examination of patient, obtaining history from patient or surrogate, ordering and review of laboratory studies, ordering and review of radiographic studies, pulse oximetry, re-evaluation of patient's condition and review of old charts   Care discussed with: accepting provider at another facility   After initial E/M assessment, critical care services were subsequently performed that were exclusive of separately billable procedures or treatment.       Medications Ordered in ED Medications  sodium chloride 0.9 % bolus 1,000 mL (0 mLs Intravenous Stopped 02/07/23 1537)  acetaminophen (TYLENOL) tablet 1,000 mg (1,000 mg Oral Given 02/07/23 1442)  diphenhydrAMINE (BENADRYL) injection 25 mg (25 mg Intravenous Given 02/07/23 1437)  metoCLOPramide (REGLAN) injection 5 mg (5 mg Intravenous Given 02/07/23 1437)  iohexol (OMNIPAQUE) 350 MG/ML injection 75 mL (75 mLs Intravenous Contrast Given 02/07/23 1417)  ondansetron (ZOFRAN) injection 4 mg (4 mg Intravenous Given 02/07/23 1530)     ED Course/ Medical Decision Making/ A&P Clinical Course as of 02/07/23 1559  Sat Feb 07, 2023  1445 Ruptured L ICA aneurysm, 4-5 mm, IVH, no hydro [JD]  1507 Attending consulted with Neurosurgeon, Patrici Ranks, PA-C who notes [SB]  1508 [place] accepts to ED [JD]  1513 Consult with ED [facility] doc, Dr. Criss Alvine who accepts patient in transfer [SB]    Clinical Course User Index [JD] Laurence Spates, MD [SB] Aislyn Hayse A, PA-C                                 Medical Decision Making Amount and/or Complexity of Data Reviewed Labs: ordered. Radiology: ordered.  Risk OTC drugs. Prescription drug management.   Pt presented to the ED with sudden onset worsening headache onset 45 minutes PTA. Has a history of headaches, however, denies previous headaches being this intense. No thinners. Vital signs pt afebrile, not tachycardic or hypoxic. On exam, pt with no focal neurodeficits on exam.  No vision changes.  Negative pronator drift. Differential diagnosis includes SAH, ICH, migraine, tension headache.  Additional history obtained:  Additional history obtained from Spouse/Significant Other  Labs:  I ordered, and personally interpreted labs.  The pertinent results include:   Troponin at 2 CBC with leukocytosis at 10.8 otherwise unremarkable BMP with slightly elevated glucose at 119, creatinine slightly elevated at 1.24, GFR at 51, Anion gap at 16.   Imaging: I ordered imaging studies including CTA head and neck  I independently visualized and interpreted imaging which showed:  1. 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 No hydrocephalus.  2. No definite evidence of vasospasm.   I agree with the radiologist interpretation  Medications:  I ordered medication including IVF, reglan, tylenol, zofran, benadryl for symptom management  I have  reviewed the patients home medicines and have made adjustments as needed  Consultations: Attending requested consultation with the Neurosurgery Patrici Ranks PA-C, and discussed lab and imaging findings as well as pertinent plan - they recommend: after consultation with Dr. Jake Samples. Neurosurgery will evaluate patient in the ED.    Disposition: Presentation suspicious for [facility] with rupture of ICA. After consideration of the diagnostic results and the patients response to treatment, I feel that the patient would benefit from Transfer to [facility] ED for Neurosurgery Evaluation. Attending discussed with patient and family regarding plans for transfer to [facility] ED for further evaluation. Pt and family agreeable at this time.     This chart was dictated using voice recognition software, Dragon. Despite the best efforts of this provider to proofread and correct errors, errors may still occur which can change documentation meaning.   Final Clinical Impression(s) / ED Diagnoses Final diagnoses:  SAH (subarachnoid hemorrhage) (HCC)    Rx / DC Orders ED Discharge Orders     None         Sixto Bowdish A, PA-C 02/07/23 1559    Laurence Spates, MD 02/08/23 [id number]

## 2023-02-07 NOTE — H&P (Signed)
Chief Complaint   Chief Complaint  Patient presents with   Migraine   Loss of Consciousness    History of Present Illness  Misty Clarke is a 57 y.o. female presenting to med [employer] with sudden onset of severe headache.  She apparently passed out after onset of severe headache.  She is also complaining of photophobia and nausea vomiting.  She does also complain of some numbness and tingling in both hands.  Patient does not complain of any changes in her vision or focal weakness.  Of note, the patient has a history of dyslipidemia.  No history of hypertension, diabetes, heart attack, or stroke.  No known lung, liver, kidney disease.  She is not on any blood thinners or antiplatelet agents. She is a non-smoker. There is a distant great-aunt with brain aneurysm in the family.  Past Medical History   Past Medical History:  Diagnosis Date   Hyperlipidemia     Past Surgical History   Past Surgical History:  Procedure Laterality Date   BREAST EXCISIONAL BIOPSY Right     Social History   Social History   Tobacco Use   Smoking status: Never   Smokeless tobacco: Never  Vaping Use   Vaping status: Never Used  Substance Use Topics   Alcohol use: Yes    Comment: rare   Drug use: No    Medications   Prior to Admission medications   Medication Sig Start Date End Date Taking? Authorizing Provider  atorvastatin (LIPITOR) 20 MG tablet TAKE 1 TABLET BY MOUTH EVERY DAY 05/28/22   [name], MD  augmented betamethasone dipropionate (DIPROLENE-AF) 0.05 % cream 3 times/day as needed-between meals & bedtime. Patient not taking: Reported on 07/29/2021    [provider]  ibuprofen (ADVIL) 200 MG tablet Take 200 mg by mouth every 6 (six) hours as needed.  Patient not taking: Reported on 05/27/2022    [provider]  Multiple Vitamin (MULTIVITAMIN) tablet Take 1 tablet by mouth daily.    [provider]    Allergies   Allergies  Allergen  Reactions   Penicillins     Review of Systems  ROS  Neurologic Exam  Drowsy but easily arouses Speech fluent, appropriate CN grossly intact Motor exam: Upper Extremities Deltoid Bicep Tricep Grip  Right 5/5 5/5 5/5 5/5  Left 5/5 5/5 5/5 5/5   Lower Extremities IP Quad PF DF EHL  Right 5/5 5/5 5/5 5/5 5/5  Left 5/5 5/5 5/5 5/5 5/5   Sensation grossly intact to LT  Imaging  CT scan was personally reviewed and demonstrates diffuse basal subarachnoid hemorrhage.  No hydrocephalus.  CT angiogram also personally reviewed and demonstrates an approximately 5 mm medially projecting superior hypophyseal aneurysm on the left.  This aneurysm has relatively narrow neck on CTA.  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  I have reviewed with them the need for aneurysm treatment given the risk of rehemorrhage. I have reviewed the details of the procedure and the expected postoperative course and recovery. We have also discussed the risks of the procedure including risk of stroke/hemorrhage leading to weakness, paralysis,  coma, death, risk of arterial dissection, contrast nephropathy, and groin hematoma.  All their questions today were answered.  Informed consent was obtained.   [name], MD [facility]

## 2023-02-07 NOTE — Op Note (Signed)
  NEUROSURGERY BRIEF OPERATIVE  NOTE   PREOP DX: Subarachnoid Hemorrhage  POSTOP DX: Same  PROCEDURE: Diagnostic cerebral angiogram  SURGEON: Dr. [name], MD  ANESTHESIA: GETA  APPROACH: Right trans-femoral  EBL: Minimal  SPECIMENS: None  COMPLICATIONS: None  CONDITION: Stable to recovery  FINDINGS (Full report in [facility]): 1. Successful coil embolization of left superior hypophyseal artery aneurysm with small mod Raymond-Roy class II neck residual.   [name], MD [facility]

## 2023-02-07 NOTE — Progress Notes (Signed)
eLink Physician-Brief Progress Note Patient Name: Misty Clarke DOB: [birth year] MRN: [id number]   Date of Service  02/07/2023  HPI/Events of Note  57 yr old female with little PMHX presented with severe HA and found to have SAH and 5 mm medially projecting superior hypophyseal aneurysm on the left on CT angiogram.  She is now s/p embolization.  eICU Interventions  Chart reviewed     Intervention Category Evaluation Type: New Patient Evaluation  Henry Russel, P 02/07/2023, 8:19 PM

## 2023-02-07 NOTE — Transfer of Care (Signed)
Immediate Anesthesia Transfer of Care Note  Patient: Misty Clarke  Procedure(s) Performed: IR WITH ANESTHESIA  Patient Location: PACU  Anesthesia Type:General  Level of Consciousness: awake, alert , and oriented  Airway & Oxygen Therapy: Patient Spontanous Breathing and Patient connected to nasal cannula oxygen  Post-op Assessment: Report given to RN, Post -op Vital signs reviewed and stable, and Patient moving all extremities  Post vital signs: Reviewed and stable  Last Vitals:  Vitals Value Taken Time  BP 133/70 02/07/23 1830  Temp    Pulse 91 02/07/23 1833  Resp 20 02/07/23 1833  SpO2 96 % 02/07/23 1833  Vitals shown include unfiled device data.  Last Pain:  Vitals:   02/07/23 1627  TempSrc:   PainSc: 5       Patients Stated Pain Goal: 3 (02/07/23 1534)  Complications: No notable events documented.

## 2023-02-07 NOTE — ED Notes (Signed)
[facility] is 15 minutes out.

## 2023-02-07 NOTE — ED Notes (Signed)
Attempted oral temp x 2; did not register on thermometer; skin warm and dry to touch

## 2023-02-07 NOTE — Anesthesia Procedure Notes (Signed)
Arterial Line Insertion Start/End8/03/2023 5:00 PM, 02/07/2023 5:06 PM Performed by: Jodell Cipro, CRNA, CRNA  Patient location: OR. Preanesthetic checklist: patient identified, IV checked, site marked, risks and benefits discussed, surgical consent, monitors and equipment checked, pre-op evaluation, timeout performed and anesthesia consent Left, radial was placed Hand hygiene performed  and maximum sterile barriers used  Allen's test indicative of satisfactory collateral circulation Attempts: 2 (1 attempt MD, 1 attempt CRNA) Procedure performed without using ultrasound guided technique. Following insertion, dressing applied and Biopatch. Patient tolerated the procedure well with no immediate complications.

## 2023-02-07 NOTE — Anesthesia Preprocedure Evaluation (Addendum)
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 No hydrocephalus.    negative psych ROS   GI/Hepatic negative GI ROS, Neg liver ROS,,,  Endo/Other  negative endocrine ROS    Renal/GU negative Renal ROS     Musculoskeletal negative musculoskeletal ROS (+)    Abdominal   Peds  Hematology negative hematology ROS (+)   Anesthesia Other Findings   Reproductive/Obstetrics                             Anesthesia Physical Anesthesia Plan  ASA: 4 and emergent  Anesthesia Plan: General   Post-op Pain Management: Minimal or no pain anticipated   Induction: Intravenous and Rapid sequence  PONV Risk Score and Plan: 3 and Treatment may vary due to age or medical condition, Ondansetron and Dexamethasone  Airway Management Planned: Oral ETT  Additional Equipment: Arterial line  Intra-op Plan:   Post-operative Plan: Possible Post-op intubation/ventilation  Informed Consent: I have reviewed the patients History and Physical, chart, labs and discussed the procedure including the risks, benefits and alternatives for the proposed anesthesia with the patient or authorized representative who has indicated his/her understanding and acceptance.     Dental  advisory given  Plan Discussed with: CRNA, Anesthesiologist and Surgeon  Anesthesia Plan Comments:        Anesthesia Quick Evaluation

## 2023-02-07 NOTE — Anesthesia Postprocedure Evaluation (Signed)
Anesthesia Post Note  Patient: Misty Clarke  Procedure(s) Performed: IR WITH ANESTHESIA     Patient location during evaluation: PACU Anesthesia Type: General Level of consciousness: awake and alert Pain management: pain level controlled Vital Signs Assessment: post-procedure vital signs reviewed and stable Respiratory status: spontaneous breathing, nonlabored ventilation and respiratory function stable Cardiovascular status: stable and blood pressure returned to baseline Anesthetic complications: no   No notable events documented.  Last Vitals:  Vitals:   02/07/23 1845 02/07/23 1900  BP: 130/74   Pulse: 85   Resp: 14   Temp: 36.4 C 36.8 C  SpO2: 98%     Last Pain:  Vitals:   02/07/23 1845  TempSrc:   PainSc: 0-No pain                 Beryle Lathe

## 2023-02-07 NOTE — ED Triage Notes (Signed)
The patient started having a severe headache that started all at one time. She had a syncopal episode after the pain started. She is also complaining of bil hand tingling. No hx of headaches. No blood thinner use and no recent trauma.

## 2023-02-07 NOTE — Anesthesia Procedure Notes (Signed)
Procedure Name: Intubation Date/Time: 02/07/2023 4:54 PM  Performed by: Jodell Cipro, CRNAPre-anesthesia Checklist: Patient identified, Emergency Drugs available, Suction available and Patient being monitored Patient Re-evaluated:Patient Re-evaluated prior to induction Oxygen Delivery Method: Circle System Utilized Preoxygenation: Pre-oxygenation with 100% oxygen Induction Type: IV induction and Rapid sequence Laryngoscope Size: Mac and 3 Grade View: Grade II Tube type: Oral Tube size: 7.0 mm Number of attempts: 1 Airway Equipment and Method: Stylet Placement Confirmation: ETT inserted through vocal cords under direct vision, positive ETCO2 and breath sounds checked- equal and bilateral Secured at: 21 cm Tube secured with: Tape (left) Dental Injury: Teeth and Oropharynx as per pre-operative assessment

## 2023-02-08 LAB — RAPID URINE DRUG SCREEN, HOSP PERFORMED
Amphetamines: NOT DETECTED
Barbiturates: NOT DETECTED
Benzodiazepines: NOT DETECTED
Cocaine: NOT DETECTED
Opiates: NOT DETECTED
Tetrahydrocannabinol: NOT DETECTED

## 2023-02-08 LAB — PREGNANCY, URINE: Preg Test, Ur: NEGATIVE

## 2023-02-08 NOTE — Progress Notes (Signed)
   Providing Compassionate, Quality Care - Together  NEUROSURGERY PROGRESS NOTE   S: No issues overnight.   O: EXAM:  BP 113/61   Pulse 86   Temp 98.8 F (37.1 C) (Axillary)   Resp 13   Ht 5\' 8"  (1.727 m)   Wt 83.5 kg   LMP 03/06/2018   SpO2 100%   BMI 27.98 kg/m   Awake, alert, oriented  PERRL Speech fluent, appropriate  CNs grossly intact  5/5 BUE/BLE  Right groin site dressing intact, soft  ASSESSMENT:  57 y.o. 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  Please do not hesitate to call with questions or concerns.   Monia Pouch, DO Neurosurgeon [facility] Cell: [phone]

## 2023-02-08 NOTE — Evaluation (Signed)
Occupational Therapy Evaluation Patient Details Name: Misty Clarke MRN: [id number] DOB: 1965-10-24 Today's Date: 02/08/2023   History of Present Illness Pt is a 57 y.o. F who presents 02/07/2023 with sudden onset of severe headache. CT scan showing diffuse basal SAH. S/p coil embolization of  left superior hypophyseal artery aneurysm. Significant PMH: none.   Clinical Impression   Misty Clarke was evaluated s/p the above admission list. She is indep, work and lives with her family at baseline. Upon evaluation, pt demonstrated mod I ability to complete mobility and ADLs. Educated pt to keep a close eye on her symptoms and have family assist with IADLs for safety initially, pt verbalized understanding. Pt does not require further acute, or follow up OT services. Recommend discharge back to pt's environment with assist as needed. OT to sign off with appreciation of order, please re-consult if needed.         If plan is discharge home, recommend the following: Assist for transportation    Functional Status Assessment  Patient has had a recent decline in their functional status and demonstrates the ability to make significant improvements in function in a reasonable and predictable amount of time.  Equipment Recommendations  None recommended by OT       Precautions / Restrictions Precautions Precautions: None Precaution Comments: a line Restrictions Weight Bearing Restrictions: No      Mobility Bed Mobility Overal bed mobility: Independent                  Transfers Overall transfer level: Independent                        Balance Overall balance assessment: No apparent balance deficits (not formally assessed)                                         ADL either performed or assessed with clinical judgement   ADL Overall ADL's : Modified independent;At baseline                                       General ADL Comments: assist for  A-line management only, otherwise pt mod I     Vision Baseline Vision/History: 1 Wears glasses Vision Assessment?: No apparent visual deficits     Perception Perception: Within Functional Limits       Praxis Praxis: WFL       Pertinent Vitals/Pain Pain Assessment Pain Assessment: 0-10 Pain Score: 2  Pain Location: HA Pain Descriptors / Indicators: Headache Pain Intervention(s): Limited activity within patient's tolerance, Monitored during session     Extremity/Trunk Assessment Upper Extremity Assessment Upper Extremity Assessment: Overall WFL for tasks assessed   Lower Extremity Assessment Lower Extremity Assessment: Overall WFL for tasks assessed   Cervical / Trunk Assessment Cervical / Trunk Assessment: Normal   Communication Communication Communication: No apparent difficulties   Cognition Arousal: Alert Behavior During Therapy: WFL for tasks assessed/performed Overall Cognitive Status: Within Functional Limits for tasks assessed                                       General Comments  VSS, mom present    Exercises     Shoulder Instructions  Home Living Family/patient expects to be discharged to:: Private residence Living Arrangements: Spouse/significant other;Children Available Help at Discharge: Family;Available 24 hours/day Type of Home: House Home Access: Stairs to enter [employer] of Steps: 1 Entrance Stairs-Rails: None Home Layout: One level     Bathroom Shower/Tub: Producer, television/film/video: Handicapped height     Home Equipment: Agricultural consultant (2 wheels);Cane - single point;Shower seat          Prior Functioning/Environment Prior Level of Function : Independent/Modified Independent;Driving;Working/employed             Mobility Comments: no AD ADLs Comments: indep        OT Problem List: Pain;Decreased activity tolerance      OT Treatment/Interventions: Patient/family education     OT Goals(Current goals can be found in the care plan section) Acute Rehab OT Goals Patient Stated Goal: home OT Goal Formulation: With patient Time For Goal Achievement: 02/22/23 Potential to Achieve Goals: Good  OT Frequency: Min 1X/week    Co-evaluation              AM-PAC OT "6 Clicks" Daily Activity     Outcome Measure Help from another person eating meals?: None Help from another person taking care of personal grooming?: None Help from another person toileting, which includes using toliet, bedpan, or urinal?: None Help from another person bathing (including washing, rinsing, drying)?: None Help from another person to put on and taking off regular upper body clothing?: None Help from another person to put on and taking off regular lower body clothing?: None 6 Click Score: 24   End of Session Nurse Communication: Mobility status  Activity Tolerance: Patient tolerated treatment well Patient left: in bed;with call bell/phone within reach;with family/visitor present  OT Visit Diagnosis: Pain                Time: 4696-2952 OT Time Calculation (min): 19 min Charges:  OT General Charges $OT Visit: 1 Visit OT Evaluation $OT Eval Low Complexity: 1 Low  Derenda Mis, OTR/L Acute Rehabilitation Services Office [phone] Secure Chat Communication Preferred   Donia Pounds 02/08/2023, 11:46 AM

## 2023-02-08 NOTE — Progress Notes (Signed)
PT Cancellation Note  Patient Details Name: Misty Clarke MRN: [id number] DOB: 10/19/1965   Cancelled Treatment:    Reason Eval/Treat Not Completed: PT screened, no needs identified, will sign off (OT evaluated; pt independent)  Lillia Pauls, PT, DPT Acute Rehabilitation Services Office [phone]    [name] 02/08/2023, 11:08 AM

## 2023-02-09 ENCOUNTER — Encounter (HOSPITAL_COMMUNITY): Payer: Self-pay

## 2023-02-09 ENCOUNTER — Inpatient Hospital Stay (HOSPITAL_COMMUNITY): Payer: BC Managed Care – PPO

## 2023-02-09 DIAGNOSIS — I609 Nontraumatic subarachnoid hemorrhage, unspecified: Secondary | ICD-10-CM

## 2023-02-09 NOTE — Progress Notes (Signed)
SLP Cancellation Note  Patient Details Name: Misty Clarke MRN: [id number] DOB: [birth year]   Cancelled treatment:       Reason Eval/Treat Not Completed: SLP screened, no needs identified, will sign off   Juliann Olesky, Riley Nearing 02/09/2023, 12:43 PM

## 2023-02-09 NOTE — Progress Notes (Addendum)
  NEUROSURGERY PROGRESS NOTE   No issues overnight. Pt with minimal HA. Has been OOB, voiding normally, tolerating diet.  EXAM:  BP 111/65 (BP Location: Right Arm)   Pulse (!) 58   Temp 99 F (37.2 C) (Oral)   Resp 12   Ht 5\' 8"  (1.727 m)   Wt 83.5 kg   LMP 03/06/2018   SpO2 97%   BMI 27.98 kg/m   Awake, alert, oriented  Speech fluent, appropriate  CN grossly intact  5/5 BUE/BLE   TCD: Date POD PCO2 HCT BP   MCA ACA PCA OPHT SIPH VERT Basilar  8/12 RH     36.8 97/56 Right  Left   53  56   -32  -16   38  -18   18  13    -38  28   -29  -28   -29        IMPRESSION:  57 y.o. female SAH d# 3 s/p left ICA aneurysm coiling, doing well. TCD baseline normal  PLAN: - Cont supportive care - Cont Nimotop - Can KVO IVF for now   [name], MD [facility]

## 2023-02-09 NOTE — TOC CM/SW Note (Signed)
Transition of Care [facility]) - Inpatient Brief Assessment   Patient Details  Name: Misty Clarke MRN: [id number] Date of Birth: 12-28-1965  Transition of Care [facility]) CM/SW Contact:    Glennon Mac, RN Phone Number: 02/09/2023, 5:23 PM   Clinical Narrative: Pt is a 57 y.o. F who presents 02/07/2023 with sudden onset of severe headache. CT scan showing diffuse basal SAH. 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  Prior to admission, patient independent and living at home with spouse and children.  Patient continues on Nimotop therapy.  TOC will follow for home needs.    Transition of Care Asessment: Insurance and Status: Insurance coverage has been reviewed Patient has primary care physician: Yes Sharlot Gowda) Home environment has been reviewed: Lives with spouse and children Prior level of function:: Independent Prior/Current Home Services: No current home services Social Determinants of Health Reivew: SDOH reviewed no interventions necessary Readmission risk has been reviewed: Yes Transition of care needs: transition of care needs identified, TOC will continue to follow  Quintella Baton, RN, BSN  Trauma/Neuro ICU Case Manager [phone]

## 2023-02-09 NOTE — Progress Notes (Signed)
Transcranial Doppler  Date POD PCO2 HCT BP  MCA ACA PCA OPHT SIPH VERT Basilar  8/12 RH   36.8 97/56 Right  Left   53  56   -32  -16   38  -18   18  13    -38  28   -29  -28   -29           Right  Left                                            Right  Left                                             Right  Left                                             Right  Left                                            Right  Left                                            Right  Left                                        MCA = Middle Cerebral Artery      OPHT = Opthalmic Artery     BASILAR = Basilar Artery   ACA = Anterior Cerebral Artery     SIPH = Carotid Siphon PCA = Posterior Cerebral Artery   VERT = Verterbral Artery                   Normal MCA = 62+\-12 ACA = 50+\-12 PCA = 42+\-23   Rt Lindegaard = 2.65, Lt Lindegaard = 2.07  Jean Rosenthal, RDMS, RVT

## 2023-02-10 ENCOUNTER — Encounter (HOSPITAL_COMMUNITY): Payer: Self-pay | Admitting: Neurosurgery

## 2023-02-10 MED ORDER — STROKE: EARLY STAGES OF RECOVERY BOOK
Status: AC
  Filled 2023-02-10: qty 1

## 2023-02-10 NOTE — Progress Notes (Signed)
  NEUROSURGERY PROGRESS NOTE   No issues overnight. Pt with minimal HA. Ambulating well  EXAM:  BP (!) 103/53   Pulse 65   Temp 98.9 F (37.2 C) (Oral)   Resp 13   Ht 5\' 8"  (1.727 m)   Wt 83.5 kg   LMP 03/06/2018   SpO2 99%   BMI 27.98 kg/m   Awake, alert, oriented  Speech fluent, appropriate  CN grossly intact  5/5 BUE/BLE   TCD: Date POD PCO2 HCT BP   MCA ACA PCA OPHT SIPH VERT Basilar  8/12 RH     36.8 97/56 Right  Left   53  56   -32  -16   38  -18   18  13    -38  28   -29  -28   -29        IMPRESSION:  57 y.o. female SAH d# 4 s/p left ICA aneurysm coiling, doing well.   PLAN: - Cont supportive care - Cont Nimotop - Cont to mobilize   [name], MD [facility]

## 2023-02-11 ENCOUNTER — Inpatient Hospital Stay (HOSPITAL_COMMUNITY): Payer: BC Managed Care – PPO

## 2023-02-11 DIAGNOSIS — I609 Nontraumatic subarachnoid hemorrhage, unspecified: Secondary | ICD-10-CM | POA: Diagnosis not present

## 2023-02-11 NOTE — Progress Notes (Signed)
Transcranial Doppler completed. Please see CV Procedures for preliminary results.  Shona Simpson, RVT 02/11/23 1:19 PM

## 2023-02-11 NOTE — Progress Notes (Signed)
  NEUROSURGERY PROGRESS NOTE   No issues overnight. Pt with minimal HA today, had increased HA last night. Ambulating well  EXAM:  BP 119/67 (BP Location: Right Arm)   Pulse 61   Temp 99.2 F (37.3 C) (Oral)   Resp 10   Ht 5\' 8"  (1.727 m)   Wt 83.5 kg   LMP 03/06/2018   SpO2 98%   BMI 27.98 kg/m   Awake, alert, oriented  Speech fluent, appropriate  CN grossly intact  5/5 BUE/BLE   TCD: Date POD PCO2 HCT BP   MCA ACA PCA OPHT SIPH VERT Basilar  8/12 RH     36.8 97/56 Right  Left   53  56   -32  -16   38  -18   18  13    -38  28   -29  -28   -29        IMPRESSION:  57 y.o. female SAH d# 5 s/p left ICA aneurysm coiling, doing well.   PLAN: - Cont supportive care - Cont Nimotop - Cont to mobilize - TCD today   [name], MD [facility]

## 2023-02-12 LAB — GLUCOSE, CAPILLARY: Glucose-Capillary: 88 mg/dL (ref 70–99)

## 2023-02-12 MED ORDER — METHOCARBAMOL 500 MG PO TABS
500.0000 mg | ORAL_TABLET | Freq: Three times a day (TID) | ORAL | Status: DC | PRN
  Administered 2023-02-12 – 2023-02-17 (×9): 500 mg via ORAL
  Filled 2023-02-12 (×10): qty 1

## 2023-02-12 NOTE — Progress Notes (Signed)
  NEUROSURGERY PROGRESS NOTE   No issues overnight. Pt with minimal HA today, had increased HA last night. Ambulating well  EXAM:  BP 135/72   Pulse 79   Temp 99.6 F (37.6 C) (Oral)   Resp 16   Ht 5\' 8"  (1.727 m)   Wt 83.5 kg   LMP 03/06/2018   SpO2 95%   BMI 27.98 kg/m   Awake, alert, oriented  Speech fluent, appropriate  CN grossly intact  5/5 BUE/BLE   TCD: Date POD PCO2 HCT BP   MCA ACA PCA OPHT SIPH VERT Basilar  8/12 RH     36.8 97/56 Right  Left   53  56   -32  -16   38  -18   18  13    -38  28   -29  -28   -29        8/14: TCD unremarkable  IMPRESSION:  57 y.o. female SAH d# 6 s/p left ICA aneurysm coiling, doing well.   PLAN: - Cont supportive care - Cont Nimotop - Cont to mobilize - TCD romorrow   [name], MD [facility]

## 2023-02-13 ENCOUNTER — Inpatient Hospital Stay (HOSPITAL_COMMUNITY): Payer: BC Managed Care – PPO

## 2023-02-13 DIAGNOSIS — I609 Nontraumatic subarachnoid hemorrhage, unspecified: Secondary | ICD-10-CM

## 2023-02-13 NOTE — Progress Notes (Signed)
Transcranial Doppler  Date POD PCO2 HCT BP  MCA ACA PCA OPHT SIPH VERT Basilar  8/12 RH   36.8 97/56 Right  Left   53  56   -32  -16   38  -18   18  13    -38  28   -29  -28   -29      8/14 SW     Right  Left   80  78   -22  -43   50  34   13  13   16  13   21   36   63      8/16 RH     Right  Left   57  80   -25  -45   32  28   16  16    41  29   -29  -34   -42            Right  Left                                             Right  Left                                            Right  Left                                            Right  Left                                        MCA = Middle Cerebral Artery      OPHT = Opthalmic Artery     BASILAR = Basilar Artery   ACA = Anterior Cerebral Artery     SIPH = Carotid Siphon PCA = Posterior Cerebral Artery   VERT = Verterbral Artery                   Normal MCA = 62+\-12 ACA = 50+\-12 PCA = 42+\-23   Rt Lindegaard = 2.4, Lt Lindegaard = 2.6  Jean Rosenthal, RDMS, RVT

## 2023-02-13 NOTE — Progress Notes (Signed)
Neurosurgery Service Progress Note  Subjective: No acute events overnight, doing well - walking around the unit this morning    Objective: Vitals:   02/13/23 1100 02/13/23 1200 02/13/23 1300 02/13/23 1400  BP: (!) 128/90 127/77 125/74 115/70  Pulse:      Resp: 16 15 14 14   Temp:  99.7 F (37.6 C)    TempSrc:  Oral    SpO2: 100% 100% 100% 100%  Weight:      Height:        Physical Exam: Aox3, PERRL, EOMI, FS & SS, strength 5/5x4  Assessment & Plan: 57 y.o. woman PBD7 L ICA rupture s/p coiling, recovering well.  -TCDs pending -continue standard aSAH care  Jadene Pierini  02/13/23 2:41 PM

## 2023-02-14 NOTE — Progress Notes (Signed)
   Providing Compassionate, Quality Care - Together  NEUROSURGERY PROGRESS NOTE   S: No issues overnight.   O: EXAM:  BP 110/71 (BP Location: Right Arm)   Pulse 79   Temp 100.3 F (37.9 C)   Resp 16   Ht 5\' 8"  (1.727 m)   Wt 83.5 kg   LMP 03/06/2018   SpO2 100%   BMI 27.98 kg/m   Awake, alert, oriented x3 PERRL Speech fluent, appropriate  CNs grossly intact  5/5 BUE/BLE   ASSESSMENT:  57 y.o. female with   Subarachnoid hemorrhage D8 status post left ICA aneurysm coiling   PLAN: -Continue subarachnoid hemorrhage pathway -Progressing appropriately    Thank you for allowing me to participate in this patient's care.  Please do not hesitate to call with questions or concerns.   Monia Pouch, DO Neurosurgeon [facility] Cell: [phone]

## 2023-02-15 NOTE — Progress Notes (Signed)
   Providing Compassionate, Quality Care - Together  NEUROSURGERY PROGRESS NOTE   S: No issues overnight.   O: EXAM:  BP 110/60 (BP Location: Right Arm)   Pulse 79   Temp 98.4 F (36.9 C) (Oral)   Resp 15   Ht 5\' 8"  (1.727 m)   Wt 83.5 kg   LMP 03/06/2018   SpO2 99%   BMI 27.98 kg/m   Awake, alert, oriented x3 PERRL Speech fluent, appropriate  CNs grossly intact  5/5 BUE/BLE    ASSESSMENT:  57 y.o. female with    Subarachnoid hemorrhage D9 status post left ICA aneurysm coiling     PLAN: -Continue subarachnoid hemorrhage pathway -Progressing appropriately    Thank you for allowing me to participate in this patient's care.  Please do not hesitate to call with questions or concerns.   Monia Pouch, DO Neurosurgeon [facility] Cell: [phone]

## 2023-02-16 ENCOUNTER — Inpatient Hospital Stay (HOSPITAL_COMMUNITY): Payer: BC Managed Care – PPO

## 2023-02-16 DIAGNOSIS — I609 Nontraumatic subarachnoid hemorrhage, unspecified: Secondary | ICD-10-CM

## 2023-02-16 NOTE — Progress Notes (Signed)
  NEUROSURGERY PROGRESS NOTE   No issues overnight. Pt with somewhat increased HA and back pain today, otherwise no c/o N/T/W.  EXAM:  BP 130/72 (BP Location: Right Arm)   Pulse 75   Temp 99.1 F (37.3 C) (Oral)   Resp (!) 30   Ht 5\' 8"  (1.727 m)   Wt 83.5 kg   LMP 03/06/2018   SpO2 100%   BMI 27.98 kg/m   Awake, alert, oriented  Speech fluent, appropriate  CN grossly intact  5/5 BUE/BLE   TCD: Date POD PCO2 HCT BP   MCA ACA PCA OPHT SIPH VERT Basilar  8/12 RH     36.8 97/56 Right  Left   53  56   -32  -16   38  -18   18  13    -38  28   -29  -28   -29       8/14 SW         Right  Left   80  78   -22  -43   50  34   13  13   16  13   21   36   63       8/16 RH         Right  Left   57  80   -25  -45   32  28   16  16    41  29   -29  -34   -42         IMPRESSION:  57 y.o. female SAH d# 10 s/p left ICA aneurysm coiling, doing well.   PLAN: - Cont supportive care - Cont Nimotop - Cont to mobilize - TCD today - If TCD remains stable and clinically well, can consider d/c home tomorrow   [name], MD [facility]

## 2023-02-16 NOTE — Progress Notes (Signed)
Transcranial Doppler  Date POD PCO2 HCT BP  MCA ACA PCA OPHT SIPH VERT Basilar  8/12 RH   36.8 97/56 Right  Left   53  56   -32  -16   38  -18   18  13    -38  28   -29  -28   -29      8/14 SW     Right  Left   80  78   -22  -43   50  34   13  13   16  13   21   36   63      8/16 RH     Right  Left   57  80   -25  -45   32  28   16  16    41  29   -29  -34   -42      8/19 MM    130/72 Right  Left   55  71   -26  -39   29  19   15  14    *  *   -26  -18   -32            Right  Left                                            Right  Left                                            Right  Left                                        MCA = Middle Cerebral Artery      OPHT = Opthalmic Artery     BASILAR = Basilar Artery   ACA = Anterior Cerebral Artery     SIPH = Carotid Siphon PCA = Posterior Cerebral Artery   VERT = Verterbral Artery     *= Unable to insonate               Normal MCA = 62+\-12 ACA = 50+\-12 PCA = 42+\-23   Rt Lindegaard = 1.96, Lt Lindegaard = 3.09  Please see CV Procedures for preliminary results.  Kennice Finnie, RVT  1:08 PM 02/16/23

## 2023-02-17 ENCOUNTER — Other Ambulatory Visit (HOSPITAL_COMMUNITY): Payer: Self-pay

## 2023-02-17 MED ORDER — NIMODIPINE 30 MG PO CAPS
60.0000 mg | ORAL_CAPSULE | ORAL | 0 refills | Status: DC
  Filled 2023-02-17: qty 130, 11d supply, fill #0

## 2023-02-17 MED ORDER — ACETAMINOPHEN 325 MG PO TABS: 650.0000 mg | ORAL_TABLET | ORAL | Status: DC | PRN

## 2023-02-17 NOTE — Discharge Summary (Signed)
Physician Discharge Summary  Patient ID: Misty Clarke MRN: [id number] DOB/AGE: [birth year] 57 y.o.  Admit date: 02/07/2023 Discharge date: 02/17/2023  Admission Diagnoses:  Subarachnoid Hemorrhage  Discharge Diagnoses:  Same Principal Problem:   Subarachnoid hemorrhage [facility])   Discharged Condition: Stable  Hospital Course:  Misty Clarke is a 57 y.o. female admitted with subarachnoid hemorrhage.  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  She was at neurologic baseline postoperatively.  She was monitored in the intensive care unit for over a week without clinical vasospasm.  Her TCD monitoring was also normal.  She was ambulating well, tolerating diet, and voiding normally.  Her headaches slowly improved.  On post hemorrhage day 11 she was discharged home in stable condition.  Treatments: Surgery - LICA aneurysm coiling  Discharge Exam: Blood pressure 120/73, pulse 90, temperature 99.1 F (37.3 C), temperature source Oral, resp. rate 20, height 5\' 8"  (1.727 m), weight 83.5 kg, last menstrual period 03/06/2018, SpO2 100%. Awake, alert, oriented Speech fluent, appropriate CN grossly intact 5/5 BUE/BLE Wound c/d/i  Disposition: Discharge disposition: 01-Home or Self Care       Discharge Instructions     Call MD for:  redness, tenderness, or signs of infection (pain, swelling, redness, odor or green/yellow discharge around incision site)   Complete by: As directed    Call MD for:  temperature >100.4   Complete by: As directed    Diet - low sodium heart healthy   Complete by: As directed    Discharge instructions   Complete by: As directed    Walk at home as much as possible, at least 4 times / day   Increase activity slowly   Complete by: As directed    Lifting restrictions   Complete by: As directed    No lifting > 10 lbs   May shower / Bathe   Complete by: As directed    48 hours after surgery   May  walk up steps   Complete by: As directed    Other Restrictions   Complete by: As directed    No bending/twisting at waist      Allergies as of 02/17/2023       Reactions   Penicillins         Medication List     TAKE these medications    acetaminophen 325 MG tablet Commonly known as: TYLENOL Take 2 tablets (650 mg total) by mouth every 4 (four) hours as needed for mild pain (or temp > 37.5 C (99.5 F)).   atorvastatin 20 MG tablet Commonly known as: LIPITOR TAKE 1 TABLET BY MOUTH EVERY DAY What changed:  how much to take how to take this when to take this additional instructions   augmented betamethasone dipropionate 0.05 % cream Commonly known as: DIPROLENE-AF 3 times/day as needed-between meals & bedtime.   ibuprofen 200 MG tablet Commonly known as: ADVIL Take 200 mg by mouth every 6 (six) hours as needed.   multivitamin tablet Take 1 tablet by mouth daily.   niMODipine 30 MG capsule Commonly known as: NIMOTOP Take 2 capsules (60 mg total) by mouth every 4 (four) hours for 11 days.        Follow-up Information     [name], MD Follow up in 3 week(s).   Specialty: Neurosurgery Contact information: [address] [place] [address] [phone]  SignedJackelyn Hoehn 02/17/2023, 12:35 PM

## 2023-02-17 NOTE — Plan of Care (Signed)
Problem: Education: Goal: Knowledge of disease or condition will improve 02/17/2023 1355 by Pamelia Hoit, RN Outcome: Completed/Met 02/17/2023 1355 by Pamelia Hoit, RN Outcome: Progressing Goal: Knowledge of secondary prevention will improve (MUST DOCUMENT ALL) 02/17/2023 1355 by Pamelia Hoit, RN Outcome: Completed/Met 02/17/2023 1355 by Pamelia Hoit, RN Outcome: Progressing Goal: Knowledge of patient specific risk factors will improve Misty Clarke N/A or DELETE if not current risk factor) 02/17/2023 1355 by Pamelia Hoit, RN Outcome: Completed/Met 02/17/2023 1355 by Pamelia Hoit, RN Outcome: Progressing   Problem: Spontaneous Subarachnoid Hemorrhage Tissue Perfusion: Goal: Complications of Spontaneous Subarachnoid Hemorrhage will be minimized 02/17/2023 1355 by Pamelia Hoit, RN Outcome: Completed/Met 02/17/2023 1355 by Pamelia Hoit, RN Outcome: Progressing   Problem: Coping: Goal: Will verbalize positive feelings about self 02/17/2023 1355 by Pamelia Hoit, RN Outcome: Completed/Met 02/17/2023 1355 by Pamelia Hoit, RN Outcome: Progressing Goal: Will identify appropriate support needs 02/17/2023 1355 by Pamelia Hoit, RN Outcome: Completed/Met 02/17/2023 1355 by Pamelia Hoit, RN Outcome: Progressing   Problem: Health Behavior/Discharge Planning: Goal: Ability to manage health-related needs will improve 02/17/2023 1355 by Pamelia Hoit, RN Outcome: Completed/Met 02/17/2023 1355 by Pamelia Hoit, RN Outcome: Progressing Goal: Goals will be collaboratively established with patient/family 02/17/2023 1355 by Pamelia Hoit, RN Outcome: Completed/Met 02/17/2023 1355 by Pamelia Hoit, RN Outcome: Progressing   Problem: Self-Care: Goal: Ability to participate in self-care as condition permits will improve 02/17/2023 1355 by Pamelia Hoit, RN Outcome: Completed/Met 02/17/2023 1355 by Pamelia Hoit, RN Outcome: Progressing Goal: Verbalization of feelings and concerns  over difficulty with self-care will improve 02/17/2023 1355 by Pamelia Hoit, RN Outcome: Completed/Met 02/17/2023 1355 by Pamelia Hoit, RN Outcome: Progressing Goal: Ability to communicate needs accurately will improve 02/17/2023 1355 by Pamelia Hoit, RN Outcome: Completed/Met 02/17/2023 1355 by Pamelia Hoit, RN Outcome: Progressing   Problem: Nutrition: Goal: Risk of aspiration will decrease 02/17/2023 1355 by Pamelia Hoit, RN Outcome: Completed/Met 02/17/2023 1355 by Pamelia Hoit, RN Outcome: Progressing Goal: Dietary intake will improve 02/17/2023 1355 by Pamelia Hoit, RN Outcome: Completed/Met 02/17/2023 1355 by Pamelia Hoit, RN Outcome: Progressing   Problem: Education: Goal: Knowledge of General Education information will improve Description: Including pain rating scale, medication(s)/side effects and non-pharmacologic comfort measures 02/17/2023 1355 by Pamelia Hoit, RN Outcome: Completed/Met 02/17/2023 1355 by Pamelia Hoit, RN Outcome: Progressing   Problem: Health Behavior/Discharge Planning: Goal: Ability to manage health-related needs will improve 02/17/2023 1355 by Pamelia Hoit, RN Outcome: Completed/Met 02/17/2023 1355 by Pamelia Hoit, RN Outcome: Progressing   Problem: Clinical Measurements: Goal: Ability to maintain clinical measurements within normal limits will improve 02/17/2023 1355 by Pamelia Hoit, RN Outcome: Completed/Met 02/17/2023 1355 by Pamelia Hoit, RN Outcome: Progressing Goal: Will remain free from infection 02/17/2023 1355 by Pamelia Hoit, RN Outcome: Completed/Met 02/17/2023 1355 by Pamelia Hoit, RN Outcome: Progressing Goal: Diagnostic test results will improve 02/17/2023 1355 by Pamelia Hoit, RN Outcome: Completed/Met 02/17/2023 1355 by Pamelia Hoit, RN Outcome: Progressing Goal: Respiratory complications will improve 02/17/2023 1355 by Pamelia Hoit, RN Outcome: Completed/Met 02/17/2023 1355 by Pamelia Hoit,  RN Outcome: Progressing Goal: Cardiovascular complication will be avoided 02/17/2023 1355 by Pamelia Hoit, RN Outcome: Completed/Met 02/17/2023 1355 by Pamelia Hoit, RN Outcome: Progressing   Problem: Activity: Goal: Risk for activity intolerance will decrease 02/17/2023 1355 by Pamelia Hoit, RN  Outcome: Completed/Met 02/17/2023 1355 by Pamelia Hoit, RN Outcome: Progressing   Problem: Nutrition: Goal: Adequate nutrition will be maintained 02/17/2023 1355 by Pamelia Hoit, RN Outcome: Completed/Met 02/17/2023 1355 by Pamelia Hoit, RN Outcome: Progressing   Problem: Coping: Goal: Level of anxiety will decrease 02/17/2023 1355 by Pamelia Hoit, RN Outcome: Completed/Met 02/17/2023 1355 by Pamelia Hoit, RN Outcome: Progressing   Problem: Elimination: Goal: Will not experience complications related to bowel motility 02/17/2023 1355 by Pamelia Hoit, RN Outcome: Completed/Met 02/17/2023 1355 by Pamelia Hoit, RN Outcome: Progressing Goal: Will not experience complications related to urinary retention 02/17/2023 1355 by Pamelia Hoit, RN Outcome: Completed/Met 02/17/2023 1355 by Pamelia Hoit, RN Outcome: Progressing   Problem: Pain Managment: Goal: General experience of comfort will improve 02/17/2023 1355 by Pamelia Hoit, RN Outcome: Completed/Met 02/17/2023 1355 by Pamelia Hoit, RN Outcome: Progressing   Problem: Safety: Goal: Ability to remain free from injury will improve 02/17/2023 1355 by Pamelia Hoit, RN Outcome: Completed/Met 02/17/2023 1355 by Pamelia Hoit, RN Outcome: Progressing   Problem: Skin Integrity: Goal: Risk for impaired skin integrity will decrease 02/17/2023 1355 by Pamelia Hoit, RN Outcome: Completed/Met 02/17/2023 1355 by Pamelia Hoit, RN Outcome: Progressing

## 2023-02-18 ENCOUNTER — Telehealth: Payer: Self-pay

## 2023-02-18 NOTE — Transitions of Care (Post Inpatient/ED Visit) (Signed)
02/18/2023  Name: Misty Clarke MRN: [id number] DOB: March 21, 1966  Today's TOC FU Call Status: Today's TOC FU Call Status:: Successful TOC FU Call Completed TOC FU Call Complete Date: 02/18/23  Transition Care Management Follow-up Telephone Call Date of Discharge: 02/17/23 Discharge Facility: [facility]) Type of Discharge: Inpatient Admission Primary Inpatient Discharge Diagnosis:: "SAH" How have you been since you were released from the hospital?: Better (Pt voices he is doing well so far-rested well last night-had a little pain to legs-relieved with current pain regimen. Appetite improving. LBM yesterday. She has been up walking & moving around-walked to the mailbox this morning with no issues.) Any questions or concerns?: No  Items Reviewed: Did you receive and understand the discharge instructions provided?: Yes Medications obtained,verified, and reconciled?: Yes (Medications Reviewed) Any new allergies since your discharge?: No Dietary orders reviewed?: Yes Type of Diet Ordered:: low salt/heart healthy Do you have support at home?: Yes People in Home: spouse Name of Support/Comfort Primary Source: [name]  Medications Reviewed Today: Medications Reviewed Today     Reviewed by Charlyn Minerva, RN (Registered Nurse) on 02/18/23 at 1217  Med List Status: <None>   Medication Order Taking? Sig Documenting Provider Last Dose Status Informant  acetaminophen (TYLENOL) 325 MG tablet [id number] Yes Take 2 tablets (650 mg total) by mouth every 4 (four) hours as needed for mild pain (or temp > 37.5 C (99.5 F)). Misty Renshaw, MD Taking Active   atorvastatin (LIPITOR) 20 MG tablet [id number] Yes TAKE 1 TABLET BY MOUTH EVERY DAY  Patient taking differently: Take 20 mg by mouth daily.   [name], MD Taking Active Self, Pharmacy Records  augmented betamethasone dipropionate (DIPROLENE-AF) 0.05 % cream [id number] Yes 3 times/day as needed-between meals & bedtime.  [provider] Taking Active Self, Pharmacy Records           Med Note Misty Clarke, Misty Clarke Feb 08, 2023  1:19 PM)    ibuprofen (ADVIL) 200 MG tablet [id number] Yes Take 200 mg by mouth every 6 (six) hours as needed. [provider] Taking Active Self, Pharmacy Records           Med Note Misty Clarke, Misty Clarke Feb 08, 2023  1:19 PM)    Multiple Vitamin (MULTIVITAMIN) tablet [id number] Yes Take 1 tablet by mouth daily. [provider] Taking Active Self, Pharmacy Records  niMODipine (NIMOTOP) 30 MG capsule [id number] Yes Take 2 capsules (60 mg total) by mouth every 4 (four) hours for 11 days. Misty Renshaw, MD Taking Active             Home Care and Equipment/Supplies: Were Home Health Services Ordered?: NA Any new equipment or medical supplies ordered?: NA  Functional Questionnaire: Do you need assistance with bathing/showering or dressing?: No Do you need assistance with meal preparation?: Yes Do you need assistance with eating?: No Do you have difficulty maintaining continence: No Do you need assistance with getting out of bed/getting out of a chair/moving?: No Do you have difficulty managing or taking your medications?: No  Follow up appointments reviewed: PCP Follow-up appointment confirmed?: Yes Date of PCP follow-up appointment?: 03/03/23 Follow-up Provider: Dr. Susann Givens Specialist [facility] Follow-up appointment confirmed?: No Reason Specialist Follow-Up Not Confirmed: Patient has Specialist Provider Number and will Call for Appointment (pt will call neurosurgeon office today to make appt) Do you need transportation to your follow-up appointment?: No (pt aware not to resume driving until medically cleared-spouse will take her to follow  up appts) Do you understand care options if your condition(s) worsen?: Yes-patient verbalized understanding  SDOH Interventions Today    Flowsheet Row Most Recent Value  SDOH Interventions   Food  Insecurity Interventions Intervention Not Indicated  Transportation Interventions Intervention Not Indicated      TOC Interventions Today    Flowsheet Row Most Recent Value  TOC Interventions   TOC Interventions Discussed/Reviewed TOC Interventions Discussed, Post discharge activity limitations per provider, Arranged PCP follow up less than 12 days/Care Guide scheduled      Interventions Today    Flowsheet Row Most Recent Value  Chronic Disease   Chronic disease during today's visit Other  [SAH]  General Interventions   General Interventions Discussed/Reviewed General Interventions Discussed, Doctor Visits  Doctor Visits Discussed/Reviewed Doctor Visits Discussed, PCP, Specialist  PCP/Specialist Visits Compliance with follow-up visit  Exercise Interventions   Exercise Discussed/Reviewed Exercise Discussed  [pt should be walking at least 4x/day per d/c instructions]  Education Interventions   Education Provided Provided Education  Provided Verbal Education On When to see the doctor, Nutrition, Medication, Other  [sx/pain mgmt]  Nutrition Interventions   Nutrition Discussed/Reviewed Nutrition Discussed  Pharmacy Interventions   Pharmacy Dicussed/Reviewed Pharmacy Topics Discussed, Medications and their functions  Safety Interventions   Safety Discussed/Reviewed Safety Discussed, Home Safety       Alessandra Grout [facility] Care Management Care Management Community Coordinator Direct Phone: [phone] Toll Free: [phone] Fax: [phone]

## 2023-02-24 MED FILL — Fentanyl Citrate Preservative Free (PF) Inj 100 MCG/2ML: INTRAMUSCULAR | Qty: 1 | Status: AC

## 2023-03-03 ENCOUNTER — Encounter: Payer: Self-pay | Admitting: Family Medicine

## 2023-03-03 ENCOUNTER — Telehealth: Payer: Self-pay

## 2023-03-03 ENCOUNTER — Ambulatory Visit: Payer: BC Managed Care – PPO | Admitting: Family Medicine

## 2023-03-03 VITALS — BP 110/76 | HR 82 | Ht 68.0 in | Wt 182.2 lb

## 2023-03-03 DIAGNOSIS — I609 Nontraumatic subarachnoid hemorrhage, unspecified: Secondary | ICD-10-CM

## 2023-03-03 NOTE — Patient Outreach (Signed)
  Emmi Stroke Care Coordination Follow Up  03/03/2023 Name:  Misty Clarke MRN:  [id number] DOB:  May 07, 1966  Subjective: Misty Clarke is a 57 y.o. year old female who is a primary care patient of [name], MD An Emmi alert was received indicating patient responded to questions: Sad, hopeless, anxious, or empty?. I reached out by phone to follow up on the alert and spoke to Patient She states she has mood swings where she feels sad about her stroke but feels she is doing great.  Advised that with a stroke these can be normal feeling but if she feels they are uncontrollable to talk with her physician.  She verbalized understanding and states she does have PCP appointment today.  Advised her to discuss with her PCP  Care Coordination Interventions:  Yes, provided   Follow up plan: No further intervention required.   Encounter Outcome:  Pt. Visit Completed   Yeray Tomas Idelle Jo, RN, MSN [facility]  [facility], [facility] Management Community Coordinator Direct Dial: [phone]  Fax: [phone] Website: [email or web address]

## 2023-03-03 NOTE — Patient Outreach (Signed)
Received a red flag Emmi stroke notification for M . I have assigned Antionette Fairy, RN to call for follow up and determine if there are any Case Management needs.    Iverson Alamin, Donivan [facility] Care Management Assistant [facility] Care Management [phone]

## 2023-03-03 NOTE — Progress Notes (Signed)
   Subjective:    Patient ID: Misty Clarke, female    DOB: June 09, 1966, 57 y.o.   MRN: [id number]  HPI She is here for follow-up visit after recent hospitalization and treatment for subarachnoid bleed.  She did have a aneurysm that was coiled.  At this point she has had no headache, blurred vision, double vision, weakness, numbness tingling.  She is scheduled to follow-up with Dr. Patric Dykes   Review of Systems     Objective:    Physical Exam Alert and in no distress.  DTR's and cerebellar test normal.  Normal finger-to-nose.  Tympanic membranes and canals are normal. Pharyngeal area is normal. Neck is supple without adenopathy or thyromegaly. Cardiac exam shows a regular sinus rhythm without murmurs or gallops. Lungs are clear to auscultation. The medical record including discharge summary and initial H&P was done.       Assessment & Plan:   Problem List Items Addressed This Visit     Subarachnoid hemorrhage (HCC) - Primary   Relevant Orders   CBC with Differential/Platelet   Comprehensive metabolic panel

## 2023-03-04 LAB — CBC WITH DIFFERENTIAL/PLATELET
Basophils Absolute: 0 10*3/uL (ref 0.0–0.2)
Basos: 1 %
EOS (ABSOLUTE): 0.1 10*3/uL (ref 0.0–0.4)
Eos: 3 %
Hematocrit: 41.5 % (ref 34.0–46.6)
Hemoglobin: 13.4 g/dL (ref 11.1–15.9)
Immature Grans (Abs): 0 10*3/uL (ref 0.0–0.1)
Immature Granulocytes: 0 %
Lymphocytes Absolute: 1.9 10*3/uL (ref 0.7–3.1)
Lymphs: 37 %
MCH: 29.1 pg (ref 26.6–33.0)
MCHC: 32.3 g/dL (ref 31.5–35.7)
MCV: 90 fL (ref 79–97)
Monocytes Absolute: 0.5 10*3/uL (ref 0.1–0.9)
Monocytes: 10 %
Neutrophils Absolute: 2.6 10*3/uL (ref 1.4–7.0)
Neutrophils: 49 %
Platelets: 448 10*3/uL (ref 150–450)
RBC: 4.61 x10E6/uL (ref 3.77–5.28)
RDW: 13.6 % (ref 11.7–15.4)
WBC: 5.2 10*3/uL (ref 3.4–10.8)

## 2023-03-04 LAB — COMPREHENSIVE METABOLIC PANEL
ALT: 56 IU/L — ABNORMAL HIGH (ref 0–32)
AST: 27 IU/L (ref 0–40)
Albumin: 4.9 g/dL (ref 3.8–4.9)
Alkaline Phosphatase: 117 IU/L (ref 44–121)
BUN/Creatinine Ratio: 14 (ref 9–23)
BUN: 11 mg/dL (ref 6–24)
Bilirubin Total: 0.3 mg/dL (ref 0.0–1.2)
CO2: 24 mmol/L (ref 20–29)
Calcium: 10.1 mg/dL (ref 8.7–10.2)
Chloride: 100 mmol/L (ref 96–106)
Creatinine, Ser: 0.81 mg/dL (ref 0.57–1.00)
Globulin, Total: 2.7 g/dL (ref 1.5–4.5)
Glucose: 102 mg/dL — ABNORMAL HIGH (ref 70–99)
Potassium: 4.6 mmol/L (ref 3.5–5.2)
Sodium: 139 mmol/L (ref 134–144)
Total Protein: 7.6 g/dL (ref 6.0–8.5)
eGFR: 86 mL/min/{1.73_m2} (ref >59)

## 2023-03-04 NOTE — Patient Outreach (Signed)
Received a red flag Emmi stroke notification. I have assigned Roshanda Florance, RN to call for follow up and determine if there are any Case Management needs.    Laura Greeson, CBCS, CMAA [facility] Care Management Assistant [facility] Care Management [phone]  

## 2023-03-05 ENCOUNTER — Telehealth: Payer: Self-pay

## 2023-03-05 NOTE — Patient Outreach (Signed)
  Care Coordination   03/05/2023 Name: Misty Clarke MRN: [id number] DOB: June 28, 1966   Care Coordination Outreach Attempts:  An unsuccessful telephone outreach was attempted today to offer the patient information about available care coordination services.  Follow Up Plan:  Additional outreach attempts will be made to offer the patient care coordination information and services.   Encounter Outcome:  No Answer   Care Coordination Interventions:  No, not indicated    Bary Leriche, RN, MSN [facility]  [facility], [facility] Management Community Coordinator Direct Dial: [phone]  Fax: [phone] Website: [email or web address]

## 2023-03-06 ENCOUNTER — Telehealth: Payer: Self-pay

## 2023-03-06 NOTE — Patient Outreach (Signed)
  Emmi Stroke Care Coordination Follow Up  03/06/2023 Name:  TAYZLEE SPEZIALE MRN:  [id number] DOB:  07/10/1965  Subjective: EMERSYN FRIAS is a 57 y.o. year old female who is a primary care patient of [name], MD An Emmi alert was received indicating patient responded to questions: Went to follow-up appointment?. I reached out by phone to follow up on the alert and spoke to Patient. Patient saw PCP 03/03/23  Care Coordination Interventions:  Yes, provided   Follow up plan: No further intervention required.   Encounter Outcome:  Patient Visit Completed   Bary Leriche, RN, MSN [facility]  [facility], [facility] Management Community Coordinator Direct Dial: [phone]  Fax: [phone] Website: [email or web address]

## 2023-03-11 DIAGNOSIS — I609 Nontraumatic subarachnoid hemorrhage, unspecified: Secondary | ICD-10-CM | POA: Diagnosis not present

## 2023-03-17 DIAGNOSIS — Z8742 Personal history of other diseases of the female genital tract: Secondary | ICD-10-CM | POA: Diagnosis not present

## 2023-03-17 DIAGNOSIS — Z01419 Encounter for gynecological examination (general) (routine) without abnormal findings: Secondary | ICD-10-CM | POA: Diagnosis not present

## 2023-03-17 LAB — HM PAP SMEAR
HM Pap smear: NEGATIVE
HPV, high-risk: NEGATIVE

## 2023-03-17 LAB — RESULTS CONSOLE HPV: CHL HPV: NEGATIVE

## 2023-03-18 ENCOUNTER — Encounter: Payer: Self-pay | Admitting: Family Medicine

## 2023-04-08 DIAGNOSIS — I609 Nontraumatic subarachnoid hemorrhage, unspecified: Secondary | ICD-10-CM | POA: Diagnosis not present

## 2023-06-05 ENCOUNTER — Other Ambulatory Visit: Payer: Self-pay | Admitting: Family Medicine

## 2023-06-05 DIAGNOSIS — Z1231 Encounter for screening mammogram for malignant neoplasm of breast: Secondary | ICD-10-CM

## 2023-06-11 ENCOUNTER — Other Ambulatory Visit: Payer: Self-pay | Admitting: Neurosurgery

## 2023-06-11 ENCOUNTER — Ambulatory Visit (INDEPENDENT_AMBULATORY_CARE_PROVIDER_SITE_OTHER): Payer: BC Managed Care – PPO | Admitting: Family Medicine

## 2023-06-11 ENCOUNTER — Other Ambulatory Visit: Payer: Self-pay | Admitting: Family Medicine

## 2023-06-11 ENCOUNTER — Encounter: Payer: Self-pay | Admitting: Family Medicine

## 2023-06-11 VITALS — BP 114/76 | HR 74 | Ht 68.0 in | Wt 188.6 lb

## 2023-06-11 DIAGNOSIS — N951 Menopausal and female climacteric states: Secondary | ICD-10-CM

## 2023-06-11 DIAGNOSIS — Z23 Encounter for immunization: Secondary | ICD-10-CM

## 2023-06-11 DIAGNOSIS — Z8249 Family history of ischemic heart disease and other diseases of the circulatory system: Secondary | ICD-10-CM | POA: Diagnosis not present

## 2023-06-11 DIAGNOSIS — Z0000 Encounter for general adult medical examination without abnormal findings: Secondary | ICD-10-CM | POA: Diagnosis not present

## 2023-06-11 DIAGNOSIS — I609 Nontraumatic subarachnoid hemorrhage, unspecified: Secondary | ICD-10-CM

## 2023-06-11 DIAGNOSIS — Z833 Family history of diabetes mellitus: Secondary | ICD-10-CM

## 2023-06-11 DIAGNOSIS — E782 Mixed hyperlipidemia: Secondary | ICD-10-CM

## 2023-06-11 DIAGNOSIS — J309 Allergic rhinitis, unspecified: Secondary | ICD-10-CM

## 2023-06-11 DIAGNOSIS — E785 Hyperlipidemia, unspecified: Secondary | ICD-10-CM

## 2023-06-11 LAB — LIPID PANEL
Chol/HDL Ratio: 2.6 {ratio} (ref 0.0–4.4)
Cholesterol, Total: 176 mg/dL (ref 100–199)
HDL: 69 mg/dL (ref >39)
LDL Chol Calc (NIH): 94 mg/dL (ref 0–99)
Triglycerides: 66 mg/dL (ref 0–149)
VLDL Cholesterol Cal: 13 mg/dL (ref 5–40)

## 2023-06-11 MED ORDER — ATORVASTATIN CALCIUM 20 MG PO TABS
ORAL_TABLET | ORAL | 3 refills | Status: DC
Start: 2023-06-11 — End: 2023-06-12

## 2023-06-11 NOTE — Progress Notes (Signed)
Complete physical exam  Patient: Misty Clarke   DOB: 07-21-1965   57 y.o. Female  MRN: [id number]  Subjective:    Chief Complaint  Patient presents with   Annual Exam    Fasting.     Misty Clarke is a 57 y.o. female who presents today for a complete physical exam. She reports consuming a general diet. Home exercise routine includes walking 5-7 hrs per week and bowling. She generally feels well. She reports sleeping fairly well. She did have a subarachnoid bleed and is being followed by neurosurgery.  She does have a scheduled follow-up appointment concerning the bleed and subsequent stent.  She continues on Lipitor and having no difficulty with that.  She is having some postmenopausal symptoms but is not interested in medication.  Her allergies are under good control family history is unchanged.  Work and home life are stable  Most recent fall risk assessment:    06/11/2023    9:25 AM  Fall Risk   Falls in the past year? 0  Number falls in past yr: 0  Injury with Fall? 0     Most recent depression screenings:    06/11/2023    9:25 AM 03/03/2023   11:04 AM  PHQ 2/9 Scores  PHQ - 2 Score 0 0    Vision:Within last year and Dental: No current dental problems and Last dental visit: June 2024    Patient Care Team: [name], MD as PCP - General (Family Medicine)   Outpatient Medications Prior to Visit  Medication Sig   acetaminophen (TYLENOL) 500 MG tablet Take 500 mg by mouth every 6 (six) hours as needed.   ibuprofen (ADVIL) 200 MG tablet Take 200 mg by mouth every 6 (six) hours as needed.   Multiple Vitamin (MULTIVITAMIN) tablet Take 1 tablet by mouth daily.   niMODipine (NIMOTOP) 30 MG capsule Take 2 capsules (60 mg total) by mouth every 4 (four) hours for 11 days.   [DISCONTINUED] atorvastatin (LIPITOR) 20 MG tablet TAKE 1 TABLET BY MOUTH EVERY DAY (Patient taking differently: Take 20 mg by mouth daily.)   augmented betamethasone dipropionate (DIPROLENE-AF) 0.05 %  cream 3 times/day as needed-between meals & bedtime. (Patient not taking: Reported on 03/03/2023)   No facility-administered medications prior to visit.    Review of Systems  All other systems reviewed and are negative.         Objective:     BP 114/76   Pulse 74   Ht 5\' 8"  (1.727 m)   Wt 188 lb 9.6 oz (85.5 kg)   LMP 03/06/2018   SpO2 99%   BMI 28.68 kg/m    Physical Exam  Alert and in no distress. Tympanic membranes and canals are normal. Pharyngeal area is normal. Neck is supple without adenopathy or thyromegaly. Cardiac exam shows a regular sinus rhythm without murmurs or gallops. Lungs are clear to auscultation.      Assessment & Plan:    Routine general medical examination at a health care facility - Plan: Lipid panel  Allergic rhinitis, mild  Family history of diabetes mellitus in father  Family history of heart disease in female family member before age 87  Mixed hyperlipidemia  Menopausal syndrome  Need for shingles vaccine - Plan: Zoster Recombinant (Shingrix )  Hyperlipidemia, unspecified hyperlipidemia type - Plan: atorvastatin (LIPITOR) 20 MG tablet  Immunization History  Administered Date(s) Administered   DTaP 05/11/2003   Influenza Split 07/23/2011   Influenza,inj,Quad PF,6+ Mos 04/29/2018,  05/04/2019, 05/08/2020, 05/09/2021, 05/27/2022   Influenza-Unspecified 03/20/2023   Janssen (J&J) SARS-COV-2 Vaccination 10/29/2019   Moderna Covid-19 Vaccine Bivalent Booster 66yrs & up 05/09/2021   Moderna Sars-Covid-2 Vaccination 05/08/2020   PFIZER Comirnaty(Gray Top)Covid-19 Tri-Sucrose Vaccine 03/20/2023   Pfizer(Comirnaty)Fall Seasonal Vaccine 12 years and older 05/27/2022   Tdap 04/29/2018   Unspecified SARS-COV-2 Vaccination 10/29/2019   Zoster Recombinant(Shingrix) 06/11/2023    Health Maintenance  Topic Date Due   COVID-19 Vaccine (7 - 2024-25 season) 05/15/2023   Zoster Vaccines- Shingrix (2 of 2) 08/06/2023   Fecal DNA (Cologuard)   06/05/2024   MAMMOGRAM  06/26/2024   Cervical Cancer Screening (HPV/Pap Cotest)  03/16/2028   DTaP/Tdap/Td (3 - Td or Tdap) 04/29/2028   INFLUENZA VACCINE  Completed   Hepatitis C Screening  Completed   HIV Screening  Completed   HPV VACCINES  Aged Out    Discussed health benefits of physical activity, and encouraged her to engage in regular exercise appropriate for her age and condition.  Problem List Items Addressed This Visit     Allergic rhinitis, mild   Family history of diabetes mellitus in father   Family history of heart disease in female family member before age 3   Hyperlipidemia   Relevant Medications   atorvastatin (LIPITOR) 20 MG tablet   Menopausal syndrome   Other Visit Diagnoses       Routine general medical examination at a health care facility    -  Primary   Relevant Orders   Lipid panel     Need for shingles vaccine       Relevant Orders   Zoster Recombinant (Shingrix ) (Completed)      No follow-ups on file.     Sharlot Gowda, MD

## 2023-06-12 MED ORDER — ATORVASTATIN CALCIUM 40 MG PO TABS: 40.0000 mg | ORAL_TABLET | Freq: Every day | ORAL | 3 refills | Status: DC

## 2023-06-12 NOTE — Addendum Note (Signed)
Addended by: [name] on: 06/12/2023 01:49 PM   Modules accepted: Orders

## 2023-07-03 ENCOUNTER — Ambulatory Visit
Admission: RE | Admit: 2023-07-03 | Discharge: 2023-07-03 | Disposition: A | Discharge status: Home / Self Care | Payer: BC Managed Care – PPO | Source: Ambulatory Visit | Attending: Family Medicine | Admitting: Family Medicine

## 2023-07-03 DIAGNOSIS — Z1231 Encounter for screening mammogram for malignant neoplasm of breast: Secondary | ICD-10-CM | POA: Diagnosis not present

## 2023-07-07 ENCOUNTER — Other Ambulatory Visit: Payer: Self-pay | Admitting: Neurosurgery

## 2023-07-17 ENCOUNTER — Other Ambulatory Visit: Payer: Self-pay

## 2023-07-17 ENCOUNTER — Ambulatory Visit (HOSPITAL_COMMUNITY)
Admission: RE | Admit: 2023-07-17 | Discharge: 2023-07-17 | Disposition: A | Discharge status: Home / Self Care | Payer: BC Managed Care – PPO | Source: Ambulatory Visit | Attending: Neurosurgery | Admitting: Neurosurgery

## 2023-07-17 ENCOUNTER — Other Ambulatory Visit: Payer: Self-pay | Admitting: Neurosurgery

## 2023-07-17 DIAGNOSIS — I720 Aneurysm of carotid artery: Secondary | ICD-10-CM | POA: Insufficient documentation

## 2023-07-17 DIAGNOSIS — I671 Cerebral aneurysm, nonruptured: Secondary | ICD-10-CM | POA: Diagnosis not present

## 2023-07-17 DIAGNOSIS — I609 Nontraumatic subarachnoid hemorrhage, unspecified: Secondary | ICD-10-CM

## 2023-07-17 DIAGNOSIS — Z8679 Personal history of other diseases of the circulatory system: Secondary | ICD-10-CM | POA: Diagnosis not present

## 2023-07-17 DIAGNOSIS — E785 Hyperlipidemia, unspecified: Secondary | ICD-10-CM | POA: Insufficient documentation

## 2023-07-17 DIAGNOSIS — Z48812 Encounter for surgical aftercare following surgery on the circulatory system: Secondary | ICD-10-CM | POA: Diagnosis not present

## 2023-07-17 HISTORY — PX: IR ANGIO INTRA EXTRACRAN SEL INTERNAL CAROTID UNI L MOD SED: IMG5361

## 2023-07-17 HISTORY — PX: IR US GUIDE VASC ACCESS RIGHT: IMG2390

## 2023-07-17 LAB — CBC WITH DIFFERENTIAL/PLATELET
Abs Immature Granulocytes: 0.01 10*3/uL (ref 0.00–0.07)
Basophils Absolute: 0.1 10*3/uL (ref 0.0–0.1)
Basophils Relative: 1 %
Eosinophils Absolute: 0.1 10*3/uL (ref 0.0–0.5)
Eosinophils Relative: 1 %
HCT: 41.8 % (ref 36.0–46.0)
Hemoglobin: 14.1 g/dL (ref 12.0–15.0)
Immature Granulocytes: 0 %
Lymphocytes Relative: 43 %
Lymphs Abs: 2.5 10*3/uL (ref 0.7–4.0)
MCH: 29.8 pg (ref 26.0–34.0)
MCHC: 33.7 g/dL (ref 30.0–36.0)
MCV: 88.4 fL (ref 80.0–100.0)
Monocytes Absolute: 0.5 10*3/uL (ref 0.1–1.0)
Monocytes Relative: 9 %
Neutro Abs: 2.6 10*3/uL (ref 1.7–7.7)
Neutrophils Relative %: 46 %
Platelets: 294 10*3/uL (ref 150–400)
RBC: 4.73 MIL/uL (ref 3.87–5.11)
RDW: 12.8 % (ref 11.5–15.5)
WBC: 5.8 10*3/uL (ref 4.0–10.5)
nRBC: 0 % (ref 0.0–0.2)

## 2023-07-17 LAB — BASIC METABOLIC PANEL
Anion gap: 12 (ref 5–15)
BUN: 14 mg/dL (ref 6–20)
CO2: 23 mmol/L (ref 22–32)
Calcium: 9.7 mg/dL (ref 8.9–10.3)
Chloride: 103 mmol/L (ref 98–111)
Creatinine, Ser: 0.96 mg/dL (ref 0.44–1.00)
GFR, Estimated: >60 mL/min (ref >60)
Glucose, Bld: 104 mg/dL — ABNORMAL HIGH (ref 70–99)
Potassium: 3.8 mmol/L (ref 3.5–5.1)
Sodium: 138 mmol/L (ref 135–145)

## 2023-07-17 LAB — APTT: aPTT: 26 s (ref 24–36)

## 2023-07-17 LAB — PROTIME-INR
INR: 0.9 (ref 0.8–1.2)
Prothrombin Time: 12.6 s (ref 11.4–15.2)

## 2023-07-17 MED ORDER — HEPARIN SODIUM (PORCINE) 1000 UNIT/ML IJ SOLN
INTRAMUSCULAR | Status: AC | PRN
  Administered 2023-07-17: 3000 [IU] via INTRAVENOUS

## 2023-07-17 MED ORDER — IOHEXOL 300 MG/ML  SOLN
100.0000 mL | Freq: Once | INTRAMUSCULAR | Status: AC | PRN
  Administered 2023-07-17: 30 mL via INTRA_ARTERIAL

## 2023-07-17 MED ORDER — FENTANYL CITRATE (PF) 100 MCG/2ML IJ SOLN
INTRAMUSCULAR | Status: AC | PRN
  Administered 2023-07-17: 25 ug via INTRAVENOUS

## 2023-07-17 MED ORDER — HYDROCODONE-ACETAMINOPHEN 5-325 MG PO TABS
1.0000 | ORAL_TABLET | ORAL | Status: DC | PRN
Start: 2023-07-17 — End: 2023-07-18

## 2023-07-17 MED ORDER — HEPARIN SODIUM (PORCINE) 1000 UNIT/ML IJ SOLN
INTRAMUSCULAR | Status: AC
  Filled 2023-07-17: qty 10

## 2023-07-17 MED ORDER — VERAPAMIL HCL 2.5 MG/ML IV SOLN: INTRA_ARTERIAL | Status: AC | PRN

## 2023-07-17 MED ORDER — LIDOCAINE HCL 1 % IJ SOLN
20.0000 mL | Freq: Once | INTRAMUSCULAR | Status: AC
  Administered 2023-07-17: 1 mL

## 2023-07-17 MED ORDER — FENTANYL CITRATE (PF) 100 MCG/2ML IJ SOLN
INTRAMUSCULAR | Status: AC
  Filled 2023-07-17: qty 2

## 2023-07-17 MED ORDER — CHLORHEXIDINE GLUCONATE CLOTH 2 % EX PADS
6.0000 | MEDICATED_PAD | Freq: Once | CUTANEOUS | Status: AC
  Administered 2023-07-17: 6 via TOPICAL

## 2023-07-17 MED ORDER — LIDOCAINE HCL 1 % IJ SOLN
INTRAMUSCULAR | Status: AC
  Filled 2023-07-17: qty 20

## 2023-07-17 MED ORDER — VANCOMYCIN HCL IN DEXTROSE 1-5 GM/200ML-% IV SOLN: 1000.0000 mg | INTRAVENOUS | Status: DC

## 2023-07-17 MED ORDER — VERAPAMIL HCL 2.5 MG/ML IV SOLN
INTRAVENOUS | Status: AC
  Filled 2023-07-17: qty 2

## 2023-07-17 MED ORDER — NITROGLYCERIN 1 MG/10 ML FOR IR/CATH LAB
INTRA_ARTERIAL | Status: AC
  Filled 2023-07-17: qty 10

## 2023-07-17 MED ORDER — MIDAZOLAM HCL 2 MG/2ML IJ SOLN
INTRAMUSCULAR | Status: AC | PRN
  Administered 2023-07-17: 1 mg via INTRAVENOUS

## 2023-07-17 MED ORDER — CHLORHEXIDINE GLUCONATE CLOTH 2 % EX PADS: 6.0000 | MEDICATED_PAD | Freq: Once | CUTANEOUS | Status: DC

## 2023-07-17 MED ORDER — MIDAZOLAM HCL 2 MG/2ML IJ SOLN
INTRAMUSCULAR | Status: AC
Start: 2023-07-17 — End: ?
  Filled 2023-07-17: qty 2

## 2023-07-17 NOTE — H&P (Signed)
Chief Complaint   Aneurysm  History of Present Illness  Misty Clarke is a 58 y.o. 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 She has made an excellent recovery and presents today for routine short-term f/u.  Of note, the patient has a history of dyslipidemia.  No history of hypertension, diabetes, heart attack, or stroke.  No known lung, liver, kidney disease.  She is not on any blood thinners or antiplatelet agents. She is a non-smoker. There is a distant great-aunt with brain aneurysm in the family.   Past Medical History   Past Medical History:  Diagnosis Date   Hyperlipidemia     Past Surgical History   Past Surgical History:  Procedure Laterality Date   BREAST EXCISIONAL BIOPSY Right    IR ANGIO INTRA EXTRACRAN SEL INTERNAL CAROTID BILAT MOD SED  02/07/2023   IR ANGIO VERTEBRAL SEL VERTEBRAL UNI L MOD SED  02/07/2023   IR ANGIOGRAM FOLLOW UP STUDY  02/07/2023   IR ANGIOGRAM FOLLOW UP STUDY  02/07/2023   IR NEURO EACH ADD'L AFTER BASIC UNI LEFT (MS)  02/07/2023   IR TRANSCATH/EMBOLIZ  02/07/2023   RADIOLOGY WITH ANESTHESIA N/A 02/07/2023   Procedure: IR WITH ANESTHESIA;  Surgeon: [name], MD;  Location: [facility] OR;  Service: Radiology;  Laterality: N/A;    Social History   Social History   Tobacco Use   Smoking status: Never   Smokeless tobacco: Never  Vaping Use   Vaping status: Never Used  Substance Use Topics   Alcohol use: Yes    Comment: rare   Drug use: No    Medications   Prior to Admission medications   Medication Sig Start Date End Date Taking? Authorizing Provider  atorvastatin (LIPITOR) 40 MG tablet Take 1 tablet (40 mg total) by mouth daily. 06/12/23  Yes [name], MD  ibuprofen (ADVIL) 200 MG tablet Take 200 mg by mouth every 6 (six) hours as needed.   Yes [provider]  Multiple Vitamin (MULTIVITAMIN) tablet Take 1 tablet by mouth daily.   Yes [provider]  acetaminophen (TYLENOL) 500 MG tablet Take 500 mg by mouth every 6 (six) hours as needed.    [provider]  augmented betamethasone dipropionate (DIPROLENE-AF) 0.05 % cream 3 times/day as needed-between meals & bedtime. Patient not taking: Reported on 03/03/2023    [provider]  niMODipine (NIMOTOP) 30 MG capsule Take 2 capsules (60 mg total) by mouth every 4 (four) hours for 11 days. 02/17/23 06/11/23  [name], MD    Allergies   Allergies  Allergen Reactions   Penicillins     Review of Systems  ROS  Neurologic Exam  Awake, alert, oriented Memory and concentration grossly intact Speech fluent, appropriate CN grossly intact Motor exam: Upper Extremities Deltoid Bicep Tricep Grip  Right 5/5 5/5 5/5 5/5  Left 5/5 5/5 5/5 5/5   Lower Extremities IP Quad PF DF EHL  Right 5/5 5/5 5/5 5/5 5/5  Left 5/5 5/5 5/5 5/5 5/5   Sensation grossly intact to LT   Impression  - 58 y.o. female 90mo s/p SAH and coil embo of LICA aneurysm, doing well  Plan  - Will proceed with routine f/u angiogram  I have reviewed the indications for the procedure as well as the details of the procedure and the expected postoperative course and recovery at length with the patient in the office. We have also  reviewed in detail the risks, benefits, and alternatives to the procedure. All questions were answered and [name] provided informed consent to proceed.  [name], MD [facility]

## 2023-07-17 NOTE — Sedation Documentation (Signed)
Handoff with short stay at 1014. Right radial TR band has 13cc air with pulses intact.  1610 sheath removed and TR band applied with 13 cc air instilled. Pulses remain palpable.

## 2023-07-17 NOTE — Progress Notes (Signed)
TR band removed at 1215, gauze dressing applied. Right radial level 0, clean, dry, and intact. Patient walked to the bathroom without difficulties.

## 2023-07-17 NOTE — Discharge Instructions (Signed)

## 2023-07-27 DIAGNOSIS — I609 Nontraumatic subarachnoid hemorrhage, unspecified: Secondary | ICD-10-CM | POA: Diagnosis not present

## 2023-07-28 ENCOUNTER — Other Ambulatory Visit (HOSPITAL_COMMUNITY): Payer: Self-pay | Admitting: Neurosurgery

## 2023-07-28 DIAGNOSIS — I609 Nontraumatic subarachnoid hemorrhage, unspecified: Secondary | ICD-10-CM

## 2023-08-19 ENCOUNTER — Other Ambulatory Visit: Payer: Self-pay | Admitting: Neurosurgery

## 2023-08-21 ENCOUNTER — Other Ambulatory Visit: Payer: Self-pay | Admitting: Neurosurgery

## 2023-08-21 NOTE — Progress Notes (Signed)
 Surgical Instructions   Your procedure is scheduled on August 27, 2023. Report to [facility] Main Entrance "A" at 6:30 A.M., then check in with the Admitting office. Any questions or running late day of surgery: call [phone]  Questions prior to your surgery date: call [phone], Monday-Friday, 8am-4pm. If you experience any cold or flu symptoms such as cough, fever, chills, shortness of breath, etc. between now and your scheduled surgery, please notify us at the above number.     Remember:  Do not eat after midnight the night before your surgery   You may drink clear liquids until 5:30 the morning of your surgery.   Clear liquids allowed are: Water, Non-Citrus Juices (without pulp), Carbonated Beverages, Clear Tea (no milk, honey, etc.), Black Coffee Only (NO MILK, CREAM OR POWDERED CREAMER of any kind), and Gatorade.    Take these medicines the morning of surgery with A SIP OF WATER  aspirin EC FOLLOW INSTRUCTIONS GIVEN TO YOU BY YOUR SURGEON. atorvastatin (LIPITOR)  BRILINTA   May take these medicines IF NEEDED: acetaminophen (TYLENOL) 500 MG tablet    One week prior to surgery, STOP taking any Aspirin (unless otherwise instructed by your surgeon) Aleve, Naproxen, Ibuprofen, Motrin, Advil, Goody's, BC's, all herbal medications, fish oil, and non-prescription vitamins.                     Do NOT Smoke (Tobacco/Vaping) for 24 hours prior to your procedure.  If you use a CPAP at night, you may bring your mask/headgear for your overnight stay.   You will be asked to remove any contacts, glasses, piercing's, hearing aid's, dentures/partials prior to surgery. Please bring cases for these items if needed.    Patients discharged the day of surgery will not be allowed to drive home, and someone needs to stay with them for 24 hours.  SURGICAL WAITING ROOM VISITATION Patients may have no more than 2 support people in the waiting area - these visitors may rotate.   Pre-op  nurse will coordinate an appropriate time for 1 ADULT support person, who may not rotate, to accompany patient in pre-op.  Children under the age of 86 must have an adult with them who is not the patient and must remain in the main waiting area with an adult.  If the patient needs to stay at the hospital during part of their recovery, the visitor guidelines for inpatient rooms apply.  Please refer to the [facility] website for the visitor guidelines for any additional information.   If you received a COVID test during your pre-op visit  it is requested that you wear a mask when out in public, stay away from anyone that may not be feeling well and notify your surgeon if you develop symptoms. If you have been in contact with anyone that has tested positive in the last 10 days please notify you surgeon.      Pre-operative CHG Bathing Instructions   You can play a key role in reducing the risk of infection after surgery. Your skin needs to be as free of germs as possible. You can reduce the number of germs on your skin by washing with CHG (chlorhexidine gluconate) soap before surgery. CHG is an antiseptic soap that kills germs and continues to kill germs even after washing.   DO NOT use if you have an allergy to chlorhexidine/CHG or antibacterial soaps. If your skin becomes reddened or irritated, stop using the CHG and notify one of our RNs  at [phone].              TAKE A SHOWER THE NIGHT BEFORE SURGERY AND THE DAY OF SURGERY    Please keep in mind the following:  DO NOT shave, including legs and underarms, 48 hours prior to surgery.   You may shave your face before/day of surgery.  Place clean sheets on your bed the night before surgery Use a clean washcloth (not used since being washed) for each shower. DO NOT sleep with pet's night before surgery.  CHG Shower Instructions:  Wash your face and private area with normal soap. If you choose to wash your hair, wash first with your normal  shampoo.  After you use shampoo/soap, rinse your hair and body thoroughly to remove shampoo/soap residue.  Turn the water OFF and apply half the bottle of CHG soap to a CLEAN washcloth.  Apply CHG soap ONLY FROM YOUR NECK DOWN TO YOUR TOES (washing for 3-5 minutes)  DO NOT use CHG soap on face, private areas, open wounds, or sores.  Pay special attention to the area where your surgery is being performed.  If you are having back surgery, having someone wash your back for you may be helpful. Wait 2 minutes after CHG soap is applied, then you may rinse off the CHG soap.  Pat dry with a clean towel  Put on clean pajamas    Additional instructions for the day of surgery: DO NOT APPLY any lotions, deodorants, cologne, or perfumes.   Do not wear jewelry or makeup Do not wear nail polish, gel polish, artificial nails, or any other type of covering on natural nails (fingers and toes) Do not bring valuables to the hospital. [facility] is not responsible for valuables/personal belongings. Put on clean/comfortable clothes.  Please brush your teeth.  Ask your nurse before applying any prescription medications to the skin.

## 2023-08-24 ENCOUNTER — Encounter (HOSPITAL_COMMUNITY): Payer: Self-pay

## 2023-08-24 ENCOUNTER — Other Ambulatory Visit: Payer: Self-pay

## 2023-08-24 ENCOUNTER — Encounter (HOSPITAL_COMMUNITY)
Admission: RE | Admit: 2023-08-24 | Discharge: 2023-08-24 | Disposition: A | Discharge status: Home / Self Care | Payer: BC Managed Care – PPO | Source: Ambulatory Visit | Attending: Neurosurgery

## 2023-08-24 DIAGNOSIS — Z01812 Encounter for preprocedural laboratory examination: Secondary | ICD-10-CM | POA: Insufficient documentation

## 2023-08-24 HISTORY — DX: Aneurysm of unspecified site: I72.9

## 2023-08-24 LAB — CBC WITH DIFFERENTIAL/PLATELET
Abs Immature Granulocytes: 0.02 10*3/uL (ref 0.00–0.07)
Basophils Absolute: 0.1 10*3/uL (ref 0.0–0.1)
Basophils Relative: 1 %
Eosinophils Absolute: 0.1 10*3/uL (ref 0.0–0.5)
Eosinophils Relative: 2 %
HCT: 42.2 % (ref 36.0–46.0)
Hemoglobin: 14.2 g/dL (ref 12.0–15.0)
Immature Granulocytes: 0 %
Lymphocytes Relative: 23 %
Lymphs Abs: 1.6 10*3/uL (ref 0.7–4.0)
MCH: 29.5 pg (ref 26.0–34.0)
MCHC: 33.6 g/dL (ref 30.0–36.0)
MCV: 87.7 fL (ref 80.0–100.0)
Monocytes Absolute: 0.8 10*3/uL (ref 0.1–1.0)
Monocytes Relative: 11 %
Neutro Abs: 4.3 10*3/uL (ref 1.7–7.7)
Neutrophils Relative %: 63 %
Platelets: 256 10*3/uL (ref 150–400)
RBC: 4.81 MIL/uL (ref 3.87–5.11)
RDW: 13.4 % (ref 11.5–15.5)
WBC: 6.8 10*3/uL (ref 4.0–10.5)
nRBC: 0 % (ref 0.0–0.2)

## 2023-08-24 LAB — BASIC METABOLIC PANEL
Anion gap: 9 (ref 5–15)
BUN: 7 mg/dL (ref 6–20)
CO2: 26 mmol/L (ref 22–32)
Calcium: 9.3 mg/dL (ref 8.9–10.3)
Chloride: 104 mmol/L (ref 98–111)
Creatinine, Ser: 0.88 mg/dL (ref 0.44–1.00)
GFR, Estimated: >60 mL/min (ref >60)
Glucose, Bld: 85 mg/dL (ref 70–99)
Potassium: 4 mmol/L (ref 3.5–5.1)
Sodium: 139 mmol/L (ref 135–145)

## 2023-08-24 LAB — PROTIME-INR
INR: 1 (ref 0.8–1.2)
Prothrombin Time: 12.9 s (ref 11.4–15.2)

## 2023-08-24 LAB — APTT: aPTT: 27 s (ref 24–36)

## 2023-08-24 NOTE — Progress Notes (Signed)
 Surgical Instructions     Your procedure is scheduled on August 27, 2023. Report to [facility] Main Entrance "A" at 6:30 A.M., then check in with the Admitting office. Any questions or running late day of surgery: call [phone]   Questions prior to your surgery date: call [phone], Monday-Friday, 8am-4pm. If you experience any cold or flu symptoms such as cough, fever, chills, shortness of breath, etc. between now and your scheduled surgery, please notify us at the above number.            Remember:       Do not eat or drink after midnight the night before your surgery           Take these medicines the morning of surgery with A SIP OF WATER   aspirin EC FOLLOW INSTRUCTIONS GIVEN TO YOU BY YOUR SURGEON. atorvastatin (LIPITOR)  BRILINTA    May take these medicines IF NEEDED: acetaminophen (TYLENOL) 500 MG tablet      One week prior to surgery, STOP taking any Aspirin (unless otherwise instructed by your surgeon) Aleve, Naproxen, Ibuprofen, Motrin, Advil, Goody's, BC's, all herbal medications, fish oil, and non-prescription vitamins.                     Do NOT Smoke (Tobacco/Vaping) for 24 hours prior to your procedure.   If you use a CPAP at night, you may bring your mask/headgear for your overnight stay.   You will be asked to remove any contacts, glasses, piercing's, hearing aid's, dentures/partials prior to surgery. Please bring cases for these items if needed.    Patients discharged the day of surgery will not be allowed to drive home, and someone needs to stay with them for 24 hours.   SURGICAL WAITING ROOM VISITATION Patients may have no more than 2 support people in the waiting area - these visitors may rotate.   Pre-op nurse will coordinate an appropriate time for 1 ADULT support person, who may not rotate, to accompany patient in pre-op.  Children under the age of 57 must have an adult with them who is not the patient and must remain in the main waiting area  with an adult.   If the patient needs to stay at the hospital during part of their recovery, the visitor guidelines for inpatient rooms apply.   Please refer to the [facility] website for the visitor guidelines for any additional information.     If you received a COVID test during your pre-op visit  it is requested that you wear a mask when out in public, stay away from anyone that may not be feeling well and notify your surgeon if you develop symptoms. If you have been in contact with anyone that has tested positive in the last 10 days please notify you surgeon.         Pre-operative CHG Bathing Instructions    You can play a key role in reducing the risk of infection after surgery. Your skin needs to be as free of germs as possible. You can reduce the number of germs on your skin by washing with CHG (chlorhexidine gluconate) soap before surgery. CHG is an antiseptic soap that kills germs and continues to kill germs even after washing.    DO NOT use if you have an allergy to chlorhexidine/CHG or antibacterial soaps. If your skin becomes reddened or irritated, stop using the CHG and notify one of our RNs at [phone].  TAKE A SHOWER THE NIGHT BEFORE SURGERY AND THE DAY OF SURGERY     Please keep in mind the following:  DO NOT shave, including legs and underarms, 48 hours prior to surgery.   You may shave your face before/day of surgery.  Place clean sheets on your bed the night before surgery Use a clean washcloth (not used since being washed) for each shower. DO NOT sleep with pet's night before surgery.   CHG Shower Instructions:  Wash your face and private area with normal soap. If you choose to wash your hair, wash first with your normal shampoo.  After you use shampoo/soap, rinse your hair and body thoroughly to remove shampoo/soap residue.  Turn the water OFF and apply half the bottle of CHG soap to a CLEAN washcloth.  Apply CHG soap ONLY FROM YOUR NECK DOWN TO  YOUR TOES (washing for 3-5 minutes)  DO NOT use CHG soap on face, private areas, open wounds, or sores.  Pay special attention to the area where your surgery is being performed.  If you are having back surgery, having someone wash your back for you may be helpful. Wait 2 minutes after CHG soap is applied, then you may rinse off the CHG soap.  Pat dry with a clean towel  Put on clean pajamas     Additional instructions for the day of surgery: DO NOT APPLY any lotions, deodorants, cologne, or perfumes.   Do not wear jewelry or makeup Do not wear nail polish, gel polish, artificial nails, or any other type of covering on natural nails (fingers and toes) Do not bring valuables to the hospital. [facility] is not responsible for valuables/personal belongings. Put on clean/comfortable clothes.  Please brush your teeth.  Ask your nurse before applying any prescription medications to the skin.

## 2023-08-24 NOTE — Progress Notes (Signed)
 PCP - Dr. Sharlot Gowda Cardiologist - Denies  PPM/ICD - Denies Device Orders - n/a Rep Notified - n/a  Chest x-ray - n/a EKG - 02-07-23 Stress Test - denies ECHO - 05-18-19 Cardiac Cath - denies  Sleep Study - Denies CPAP - n/a  NON-diabetic  Last dose of GLP1 agonist-  Denies GLP1 instructions: n/a  Blood Thinner Instructions: Brilinta - only taking from 08/20/23 to 08/26/23 per physician Aspirin Instructions: only taking from 08/20/23 to 08/26/23 per physician  ERAS Protcol - NPO PRE-SURGERY Ensure or G2-  n/a  COVID TEST- No   Anesthesia review: No  Patient denies shortness of breath, fever, cough and chest pain at PAT appointment. Patient denies any respiratory issues at this time.    All instructions explained to the patient, with a verbal understanding of the material. Patient agrees to go over the instructions while at home for a better understanding. Patient also instructed to self quarantine after being tested for COVID-19. The opportunity to ask questions was provided.

## 2023-08-25 ENCOUNTER — Encounter: Payer: Self-pay | Admitting: Internal Medicine

## 2023-08-27 NOTE — Anesthesia Preprocedure Evaluation (Signed)
 Anesthesia Evaluation  Patient identified by MRN, date of birth, ID band Patient awake    Reviewed: Allergy & Precautions, NPO status , Patient's Chart, lab work & pertinent test results  History of Anesthesia Complications Negative for: history of anesthetic complications  Airway Mallampati: II  TM Distance: >3 FB Neck ROM: Full    Dental  (+) Dental Advisory Given   Pulmonary neg pulmonary ROS   Pulmonary exam normal        Cardiovascular negative cardio ROS Normal cardiovascular exam     Neuro/Psych  SAH   negative psych ROS   GI/Hepatic negative GI ROS, Neg liver ROS,,,  Endo/Other  negative endocrine ROS    Renal/GU negative Renal ROS     Musculoskeletal negative musculoskeletal ROS (+)    Abdominal   Peds  Hematology  On brilinta    Anesthesia Other Findings   Reproductive/Obstetrics                             Anesthesia Physical Anesthesia Plan  ASA: 3  Anesthesia Plan: General   Post-op Pain Management: Tylenol PO (pre-op)*   Induction: Intravenous  PONV Risk Score and Plan: 3 and Treatment may vary due to age or medical condition and Ondansetron  Airway Management Planned: Oral ETT  Additional Equipment: Arterial line  Intra-op Plan:   Post-operative Plan: Extubation in OR  Informed Consent: I have reviewed the patients History and Physical, chart, labs and discussed the procedure including the risks, benefits and alternatives for the proposed anesthesia with the patient or authorized representative who has indicated his/her understanding and acceptance.     Dental advisory given  Plan Discussed with: CRNA and Anesthesiologist  Anesthesia Plan Comments:        Anesthesia Quick Evaluation

## 2023-08-28 ENCOUNTER — Observation Stay (HOSPITAL_COMMUNITY)
Admission: RE | Admit: 2023-08-28 | Discharge: 2023-08-29 | Disposition: A | Discharge status: Home / Self Care | Payer: BC Managed Care – PPO | Attending: Neurosurgery | Admitting: Neurosurgery

## 2023-08-28 ENCOUNTER — Encounter (HOSPITAL_COMMUNITY): Payer: Self-pay | Admitting: Neurosurgery

## 2023-08-28 ENCOUNTER — Other Ambulatory Visit: Payer: Self-pay

## 2023-08-28 ENCOUNTER — Ambulatory Visit (HOSPITAL_COMMUNITY): Payer: Self-pay

## 2023-08-28 ENCOUNTER — Encounter (HOSPITAL_COMMUNITY)
Admission: RE | Disposition: A | Discharge status: Home / Self Care | Payer: Self-pay | Source: Home / Self Care | Attending: Neurosurgery

## 2023-08-28 ENCOUNTER — Observation Stay (HOSPITAL_COMMUNITY)
Admission: RE | Admit: 2023-08-28 | Discharge: 2023-08-28 | Disposition: A | Discharge status: Home / Self Care | Payer: BC Managed Care – PPO | Source: Ambulatory Visit | Attending: Neurosurgery | Admitting: Neurosurgery

## 2023-08-28 DIAGNOSIS — I609 Nontraumatic subarachnoid hemorrhage, unspecified: Secondary | ICD-10-CM

## 2023-08-28 DIAGNOSIS — Z7982 Long term (current) use of aspirin: Secondary | ICD-10-CM | POA: Insufficient documentation

## 2023-08-28 DIAGNOSIS — Z9889 Other specified postprocedural states: Secondary | ICD-10-CM | POA: Insufficient documentation

## 2023-08-28 DIAGNOSIS — I10 Essential (primary) hypertension: Secondary | ICD-10-CM | POA: Diagnosis not present

## 2023-08-28 DIAGNOSIS — I720 Aneurysm of carotid artery: Secondary | ICD-10-CM | POA: Diagnosis not present

## 2023-08-28 DIAGNOSIS — I671 Cerebral aneurysm, nonruptured: Secondary | ICD-10-CM | POA: Diagnosis present

## 2023-08-28 DIAGNOSIS — E785 Hyperlipidemia, unspecified: Secondary | ICD-10-CM | POA: Diagnosis not present

## 2023-08-28 DIAGNOSIS — Z7902 Long term (current) use of antithrombotics/antiplatelets: Secondary | ICD-10-CM | POA: Insufficient documentation

## 2023-08-28 DIAGNOSIS — Z79899 Other long term (current) drug therapy: Secondary | ICD-10-CM | POA: Insufficient documentation

## 2023-08-28 HISTORY — PX: IR ANGIOGRAM FOLLOW UP STUDY: IMG697

## 2023-08-28 HISTORY — PX: IR ANGIO INTRA EXTRACRAN SEL INTERNAL CAROTID UNI L MOD SED: IMG5361

## 2023-08-28 HISTORY — PX: IR NEURO EACH ADD'L AFTER BASIC UNI LEFT (MS): IMG5373

## 2023-08-28 HISTORY — PX: RADIOLOGY WITH ANESTHESIA: SHX6223

## 2023-08-28 HISTORY — PX: IR TRANSCATH/EMBOLIZ: IMG695

## 2023-08-28 LAB — MRSA NEXT GEN BY PCR, NASAL: MRSA by PCR Next Gen: NOT DETECTED

## 2023-08-28 SURGERY — IR WITH ANESTHESIA
Anesthesia: General

## 2023-08-28 MED ORDER — ASPIRIN 81 MG PO CHEW
81.0000 mg | CHEWABLE_TABLET | Freq: Every day | ORAL | Status: DC
  Administered 2023-08-29: 81 mg via ORAL
  Filled 2023-08-28: qty 1

## 2023-08-28 MED ORDER — ONDANSETRON HCL 4 MG/2ML IJ SOLN: 4.0000 mg | Freq: Once | INTRAMUSCULAR | Status: DC | PRN

## 2023-08-28 MED ORDER — HYDROCODONE-ACETAMINOPHEN 5-325 MG PO TABS
1.0000 | ORAL_TABLET | ORAL | Status: DC | PRN
  Administered 2023-08-28 – 2023-08-29 (×2): 1 via ORAL
  Filled 2023-08-28 (×2): qty 1

## 2023-08-28 MED ORDER — CHLORHEXIDINE GLUCONATE CLOTH 2 % EX PADS
6.0000 | MEDICATED_PAD | Freq: Once | CUTANEOUS | Status: DC
Start: 2023-08-28 — End: 2023-08-28

## 2023-08-28 MED ORDER — LABETALOL HCL 5 MG/ML IV SOLN
10.0000 mg | INTRAVENOUS | Status: DC | PRN
  Administered 2023-08-28: 10 mg via INTRAVENOUS

## 2023-08-28 MED ORDER — PROPOFOL 10 MG/ML IV BOLUS
INTRAVENOUS | Status: DC | PRN
  Administered 2023-08-28: 150 mg via INTRAVENOUS
  Administered 2023-08-28: 50 mg via INTRAVENOUS

## 2023-08-28 MED ORDER — ADULT MULTIVITAMIN W/MINERALS CH: 1.0000 | ORAL_TABLET | Freq: Every day | ORAL | Status: DC

## 2023-08-28 MED ORDER — TICAGRELOR 90 MG PO TABS
90.0000 mg | ORAL_TABLET | Freq: Two times a day (BID) | ORAL | Status: DC
  Administered 2023-08-28 – 2023-08-29 (×2): 90 mg via ORAL
  Filled 2023-08-28 (×2): qty 1

## 2023-08-28 MED ORDER — ONDANSETRON HCL 4 MG/2ML IJ SOLN
INTRAMUSCULAR | Status: DC | PRN
  Administered 2023-08-28: 4 mg via INTRAVENOUS

## 2023-08-28 MED ORDER — LACTATED RINGERS IV SOLN: INTRAVENOUS | Status: DC | PRN

## 2023-08-28 MED ORDER — ASPIRIN 81 MG PO CHEW
81.0000 mg | CHEWABLE_TABLET | Freq: Once | ORAL | Status: AC
  Administered 2023-08-28: 81 mg via ORAL
  Filled 2023-08-28: qty 1

## 2023-08-28 MED ORDER — ACETAMINOPHEN 500 MG PO TABS: 1000.0000 mg | ORAL_TABLET | Freq: Once | ORAL | Status: AC

## 2023-08-28 MED ORDER — ROCURONIUM BROMIDE 10 MG/ML (PF) SYRINGE
PREFILLED_SYRINGE | INTRAVENOUS | Status: DC | PRN
  Administered 2023-08-28: 5 mg via INTRAVENOUS
  Administered 2023-08-28: 50 mg via INTRAVENOUS
  Administered 2023-08-28: 10 mg via INTRAVENOUS

## 2023-08-28 MED ORDER — LABETALOL HCL 5 MG/ML IV SOLN
INTRAVENOUS | Status: AC
Start: 2023-08-28 — End: 2023-08-28
  Filled 2023-08-28: qty 4

## 2023-08-28 MED ORDER — CLEVIDIPINE BUTYRATE 0.5 MG/ML IV EMUL: 0.0000 mg/h | INTRAVENOUS | Status: DC

## 2023-08-28 MED ORDER — CHLORHEXIDINE GLUCONATE CLOTH 2 % EX PADS
6.0000 | MEDICATED_PAD | Freq: Every day | CUTANEOUS | Status: DC
  Administered 2023-08-28: 6 via TOPICAL

## 2023-08-28 MED ORDER — CHLORHEXIDINE GLUCONATE 0.12 % MT SOLN
OROMUCOSAL | Status: AC
  Administered 2023-08-28: 15 mL
  Filled 2023-08-28: qty 15

## 2023-08-28 MED ORDER — PHENYLEPHRINE HCL-NACL 20-0.9 MG/250ML-% IV SOLN
INTRAVENOUS | Status: DC | PRN
  Administered 2023-08-28: 40 ug/min via INTRAVENOUS

## 2023-08-28 MED ORDER — ATORVASTATIN CALCIUM 40 MG PO TABS
40.0000 mg | ORAL_TABLET | Freq: Every day | ORAL | Status: DC
Start: 2023-08-28 — End: 2023-08-29
  Administered 2023-08-29: 40 mg via ORAL
  Filled 2023-08-28: qty 1

## 2023-08-28 MED ORDER — LIDOCAINE 2% (20 MG/ML) 5 ML SYRINGE
INTRAMUSCULAR | Status: DC | PRN
Start: 2023-08-28 — End: 2023-08-28
  Administered 2023-08-28: 60 mg via INTRAVENOUS

## 2023-08-28 MED ORDER — DOCUSATE SODIUM 100 MG PO CAPS
100.0000 mg | ORAL_CAPSULE | Freq: Two times a day (BID) | ORAL | Status: DC
  Filled 2023-08-28: qty 1

## 2023-08-28 MED ORDER — ACETAMINOPHEN 500 MG PO TABS
ORAL_TABLET | ORAL | Status: AC
  Administered 2023-08-28: 1000 mg via ORAL
  Filled 2023-08-28: qty 2

## 2023-08-28 MED ORDER — PHENYLEPHRINE 80 MCG/ML (10ML) SYRINGE FOR IV PUSH (FOR BLOOD PRESSURE SUPPORT)
PREFILLED_SYRINGE | INTRAVENOUS | Status: DC | PRN
  Administered 2023-08-28: 40 ug via INTRAVENOUS
  Administered 2023-08-28: 120 ug via INTRAVENOUS
  Administered 2023-08-28: 80 ug via INTRAVENOUS

## 2023-08-28 MED ORDER — DEXAMETHASONE SODIUM PHOSPHATE 10 MG/ML IJ SOLN
INTRAMUSCULAR | Status: DC | PRN
  Administered 2023-08-28: 5 mg via INTRAVENOUS

## 2023-08-28 MED ORDER — MIDAZOLAM HCL 2 MG/2ML IJ SOLN
INTRAMUSCULAR | Status: DC | PRN
  Administered 2023-08-28: 2 mg via INTRAVENOUS

## 2023-08-28 MED ORDER — ONDANSETRON HCL 4 MG PO TABS: 4.0000 mg | ORAL_TABLET | ORAL | Status: DC | PRN

## 2023-08-28 MED ORDER — OXYCODONE HCL 5 MG PO TABS: 5.0000 mg | ORAL_TABLET | Freq: Once | ORAL | Status: DC | PRN

## 2023-08-28 MED ORDER — HEPARIN SODIUM (PORCINE) 1000 UNIT/ML IJ SOLN
INTRAMUSCULAR | Status: DC | PRN
  Administered 2023-08-28: 5000 [IU] via INTRAVENOUS

## 2023-08-28 MED ORDER — MIDAZOLAM HCL 2 MG/2ML IJ SOLN
INTRAMUSCULAR | Status: AC
  Filled 2023-08-28: qty 2

## 2023-08-28 MED ORDER — OXYCODONE HCL 5 MG/5ML PO SOLN: 5.0000 mg | Freq: Once | ORAL | Status: DC | PRN

## 2023-08-28 MED ORDER — PANTOPRAZOLE SODIUM 40 MG PO TBEC
40.0000 mg | DELAYED_RELEASE_TABLET | Freq: Every day | ORAL | Status: DC
  Administered 2023-08-28: 40 mg via ORAL
  Filled 2023-08-28: qty 1

## 2023-08-28 MED ORDER — PANTOPRAZOLE SODIUM 40 MG IV SOLR: 40.0000 mg | Freq: Every day | INTRAVENOUS | Status: DC

## 2023-08-28 MED ORDER — FENTANYL CITRATE (PF) 100 MCG/2ML IJ SOLN
25.0000 ug | INTRAMUSCULAR | Status: DC | PRN
Start: 2023-08-28 — End: 2023-08-28

## 2023-08-28 MED ORDER — TICAGRELOR 90 MG PO TABS
90.0000 mg | ORAL_TABLET | Freq: Once | ORAL | Status: AC
  Administered 2023-08-28: 90 mg via ORAL
  Filled 2023-08-28: qty 1

## 2023-08-28 MED ORDER — IOHEXOL 300 MG/ML  SOLN
150.0000 mL | Freq: Once | INTRAMUSCULAR | Status: AC | PRN
  Administered 2023-08-28: 40 mL via INTRA_ARTERIAL

## 2023-08-28 MED ORDER — VANCOMYCIN HCL IN DEXTROSE 1-5 GM/200ML-% IV SOLN
INTRAVENOUS | Status: AC
  Filled 2023-08-28: qty 200

## 2023-08-28 MED ORDER — ONDANSETRON HCL 4 MG/2ML IJ SOLN: 4.0000 mg | INTRAMUSCULAR | Status: DC | PRN

## 2023-08-28 MED ORDER — SUGAMMADEX SODIUM 200 MG/2ML IV SOLN
INTRAVENOUS | Status: DC | PRN
  Administered 2023-08-28: 200 mg via INTRAVENOUS

## 2023-08-28 MED ORDER — VANCOMYCIN HCL IN DEXTROSE 1-5 GM/200ML-% IV SOLN: 1000.0000 mg | INTRAVENOUS | Status: DC

## 2023-08-28 MED ORDER — MORPHINE SULFATE (PF) 2 MG/ML IV SOLN: 1.0000 mg | INTRAVENOUS | Status: DC | PRN

## 2023-08-28 MED ORDER — FENTANYL CITRATE (PF) 250 MCG/5ML IJ SOLN
INTRAMUSCULAR | Status: DC | PRN
Start: 2023-08-28 — End: 2023-08-28
  Administered 2023-08-28: 100 ug via INTRAVENOUS

## 2023-08-28 MED ORDER — FENTANYL CITRATE (PF) 100 MCG/2ML IJ SOLN
INTRAMUSCULAR | Status: AC
  Filled 2023-08-28: qty 2

## 2023-08-28 MED ORDER — CHLORHEXIDINE GLUCONATE CLOTH 2 % EX PADS: 6.0000 | MEDICATED_PAD | Freq: Once | CUTANEOUS | Status: DC

## 2023-08-28 NOTE — Consult Note (Signed)
 NAME:  Misty Clarke, MRN:  [id number], DOB:  [birth year], LOS: 0 ADMISSION DATE:  08/28/2023, CONSULTATION DATE:  08/28/23 REFERRING MD:  Conchita Paris, CHIEF COMPLAINT:  s/p L ICA aneurysm embo   History of Present Illness:  58 yo F with PMH SAH and coil embo of L PCOM aneurysm. Follow up angiogram about 22mo later demonstrated recanalization, thus she presented 2/289 for planned L ICA pipeline embolization 08/28/23  Has been on DAPT (ASA brilinta)    Admitted to Neuro ICU post procedure as per pre-op plan  PCCM is consulted in this setting  Pertinent  Medical History  [facility]    Significant Hospital Events: Including procedures, antibiotic start and stop dates in addition to other pertinent events   2/28 L ICA pipeline embolization   Interim History / Subjective:  ICU post op   Objective   Blood pressure 135/80, pulse 73, temperature 97.7 F (36.5 C), resp. rate 17, height 5\' 8"  (1.727 m), weight 84.8 kg, last menstrual period 03/06/2018, SpO2 98%.        Intake/Output Summary (Last 24 hours) at 08/28/2023 1419 Last data filed at 08/28/2023 1310 Gross per 24 hour  Intake --  Output 955 ml  Net -955 ml   Filed Weights   08/28/23 0645  Weight: 84.8 kg    Examination: General: wdwn middle aged F NAD  HENT: NCAT  Lungs: even unlabored on RA Cardiovascular: rrr Abdomen: soft ndnt  Extremities: sheath site hematoma  Neuro: AAOx4 no focal deficits  GU: foley clear yellow urine   Resolved Hospital Problem list     Assessment & Plan:   S/p embolization of LICA aneurysm Hx  SAH  s/pL PCOM aneurysm s/p embo HTN  -great neuro recovery after SAH. Evidence of recanalization about 22mo after, prompting her presentation for pipeline embo P -admit ICU -PRN labetalol, cleviprex for SBP < 160  -fq neuro checks -mobility restrictions per NSGY  -PRN analgesia  -ASA brilinta  -hopefully diet in a little bit -hopefully foley out when she is able to mobilize a bit   Best Practice  (right click and "Reselect all SmartList Selections" daily)   Diet/type: Regular consistency (see orders) DVT prophylaxis SCD Pressure ulcer(s): N/A GI prophylaxis: PPI Lines: Arterial Line Foley:  Yes, and it is no longer needed Code Status:  full code Last date of multidisciplinary goals of care discussion [--]  Labs   CBC: Recent Labs  Lab 08/24/23 1352  WBC 6.8  NEUTROABS 4.3  HGB 14.2  HCT 42.2  MCV 87.7  PLT 256    Basic Metabolic Panel: Recent Labs  Lab 08/24/23 1352  NA 139  K 4.0  CL 104  CO2 26  GLUCOSE 85  BUN 7  CREATININE 0.88  CALCIUM 9.3   GFR: Estimated Creatinine Clearance: 81.5 mL/min (by C-G formula based on SCr of 0.88 mg/dL). Recent Labs  Lab 08/24/23 1352  WBC 6.8    Liver Function Tests: No results for input(s): "AST", "ALT", "ALKPHOS", "BILITOT", "PROT", "ALBUMIN" in the last 168 hours. No results for input(s): "LIPASE", "AMYLASE" in the last 168 hours. No results for input(s): "AMMONIA" in the last 168 hours.  ABG No results found for: "PHART", "PCO2ART", "PO2ART", "HCO3", "TCO2", "ACIDBASEDEF", "O2SAT"   Coagulation Profile: Recent Labs  Lab 08/24/23 1352  INR 1.0    Cardiac Enzymes: No results for input(s): "CKTOTAL", "CKMB", "CKMBINDEX", "TROPONINI" in the last 168 hours.  HbA1C: No results found for: "HGBA1C"  CBG: No results for input(s): "GLUCAP"  in the last 168 hours.  Review of Systems:   Review of Systems  Constitutional: Negative.   HENT: Negative.    Eyes: Negative.   Respiratory: Negative.    Cardiovascular: Negative.   Gastrointestinal: Negative.   Genitourinary: Negative.   Skin: Negative.   Neurological: Negative.   Endo/Heme/Allergies: Negative.   Psychiatric/Behavioral: Negative.       Past Medical History:  She,  has a past medical history of Aneurysm (HCC) and Hyperlipidemia.   Surgical History:   Past Surgical History:  Procedure Laterality Date   BREAST EXCISIONAL BIOPSY Right     IR ANGIO INTRA EXTRACRAN SEL INTERNAL CAROTID BILAT MOD SED  02/07/2023   IR ANGIO INTRA EXTRACRAN SEL INTERNAL CAROTID UNI L MOD SED  07/17/2023   IR ANGIO VERTEBRAL SEL VERTEBRAL UNI L MOD SED  02/07/2023   IR ANGIOGRAM FOLLOW UP STUDY  02/07/2023   IR ANGIOGRAM FOLLOW UP STUDY  02/07/2023   IR NEURO EACH ADD'L AFTER BASIC UNI LEFT (MS)  02/07/2023   IR TRANSCATH/EMBOLIZ  02/07/2023   IR TRANSCATH/EMBOLIZ  08/28/2023   IR US GUIDE VASC ACCESS RIGHT  07/17/2023   RADIOLOGY WITH ANESTHESIA N/A 02/07/2023   Procedure: IR WITH ANESTHESIA;  Surgeon: [name], MD;  [facility];  Service: Radiology;  Laterality: N/A;     Social History:   reports that she has never smoked. She has never used smokeless tobacco. She reports current alcohol use. She reports that she does not use drugs.   Family History:  Her family history includes Diabetes in her father, paternal aunt, and paternal grandfather; Heart disease (age of onset: 34) in her father; Hypertension in her father and paternal uncle; Stroke in her paternal uncle. There is no history of Breast cancer, Colon cancer, Colon polyps, Esophageal cancer, Rectal cancer, or Stomach cancer.   Allergies Allergies  Allergen Reactions   Penicillins Other (See Comments)    Joint pain     Home Medications  Prior to Admission medications   Medication Sig Start Date End Date Taking? Authorizing Provider  acetaminophen (TYLENOL) 500 MG tablet Take 500 mg by mouth every 6 (six) hours as needed.   Yes [provider]  aspirin EC 81 MG tablet Take 81 mg by mouth daily. Swallow whole.   Yes [provider]  atorvastatin (LIPITOR) 40 MG tablet Take 1 tablet (40 mg total) by mouth daily. 06/12/23  Yes [name], MD  BRILINTA 90 MG TABS tablet Take 90 mg by mouth 2 (two) times daily. 07/27/23  Yes [provider]  docusate sodium (COLACE) 100 MG capsule Take 100 mg by mouth 2 (two) times daily.   Yes [provider]   ibuprofen (ADVIL) 200 MG tablet Take 400 mg by mouth every 6 (six) hours as needed for headache, mild pain (pain score 1-3) or moderate pain (pain score 4-6).   Yes [provider]  Multiple Vitamin (MULTIVITAMIN) tablet Take 1 tablet by mouth daily.   Yes [provider]  niMODipine (NIMOTOP) 30 MG capsule Take 2 capsules (60 mg total) by mouth every 4 (four) hours for 11 days. Patient not taking: Reported on 08/20/2023 02/17/23 06/11/23  [name], MD     Critical care time: 59      CRITICAL CARE Performed by: Lanier Clam   Total critical care time: 36 minutes  Critical care time was exclusive of separately billable procedures and treating other patients. Critical care was necessary to treat or prevent  imminent or life-threatening deterioration.  Critical care was time spent personally by me on the following activities: development of treatment plan with patient and/or surrogate as well as nursing, discussions with consultants, evaluation of patient's response to treatment, examination of patient, obtaining history from patient or surrogate, ordering and performing treatments and interventions, ordering and review of laboratory studies, ordering and review of radiographic studies, pulse oximetry and re-evaluation of patient's condition.  Tessie Fass MSN, AGACNP-BC [place] [facility] Amion for pager  08/28/2023, 2:19 PM

## 2023-08-28 NOTE — TOC Initial Note (Signed)
 Transition of Care [facility]) - Initial/Assessment Note    Patient Details  Name: Misty Clarke MRN: [id number] Date of Birth: 07/06/1965  Transition of Care [facility]) CM/SW Contact:    Lamonte Sakai, Student-Social Work Phone Number: 08/28/2023, 2:10 PM  Clinical Narrative:                  Pt admitted from home with spouse. No current TOC needs identified. Please consult as needs arise.       Patient Goals and CMS Choice            Expected Discharge Plan and Services       Living arrangements for the past 2 months: Single Family Home                                      Prior Living Arrangements/Services Living arrangements for the past 2 months: Single Family Home Lives with:: Spouse                   Activities of Daily Living      Permission Sought/Granted                  Emotional Assessment       Orientation: : Oriented to Self, Oriented to Place, Oriented to Situation, Oriented to  Time      Admission diagnosis:  Aneurysm, cerebral, nonruptured [I67.1] Patient Active Problem List   Diagnosis Date Noted   Status post coil embolization of cerebral aneurysm 08/28/2023   Aneurysm, cerebral, nonruptured 08/28/2023   Subarachnoid hemorrhage (HCC) 02/07/2023   Hyperlipidemia 05/27/2022   Family history of diabetes mellitus in father 05/08/2020   Menopausal syndrome 05/08/2020   Postcoital bleeding 05/08/2020   Allergic rhinitis, mild 07/23/2011   Family history of heart disease in female family member before age 42 07/23/2011   PCP:  [name], MD Pharmacy:   [id number] - [place], [place] - [address] [place] [address] Phone: [phone] Fax: [phone]  [facility] [address] [place] [address] Phone: [phone] Fax: [phone]     Social Drivers of Health (SDOH) Social History: SDOH Screenings   Food  Insecurity: No Food Insecurity (06/11/2023)  Housing: Unknown (06/11/2023)  Transportation Needs: No Transportation Needs (06/11/2023)  Utilities: Not At Risk (06/11/2023)  Depression (PHQ2-9): Low Risk  (06/11/2023)  Financial Resource Strain: Low Risk  (06/11/2023)  Physical Activity: Sufficiently Active (06/11/2023)  Social Connections: Socially Integrated (06/11/2023)  Stress: No Stress Concern Present (06/11/2023)  Tobacco Use: Low Risk  (08/28/2023)   SDOH Interventions:     Readmission Risk Interventions     No data to display

## 2023-08-28 NOTE — Sedation Documentation (Signed)
 Pipeline stent placed at [facility]

## 2023-08-28 NOTE — Anesthesia Procedure Notes (Signed)
 Procedure Name: Intubation Date/Time: 08/28/2023 9:25 AM  Performed by: Darlina Guys, CRNAPre-anesthesia Checklist: Patient identified, Emergency Drugs available, Suction available and Patient being monitored Patient Re-evaluated:Patient Re-evaluated prior to induction Oxygen Delivery Method: Circle System Utilized Preoxygenation: Pre-oxygenation with 100% oxygen Induction Type: IV induction Ventilation: Oral airway inserted - appropriate to patient size Laryngoscope Size: Mac and 3 Grade View: Grade II Tube type: Oral Tube size: 7.0 mm Number of attempts: 1 Airway Equipment and Method: Stylet and Oral airway Placement Confirmation: ETT inserted through vocal cords under direct vision, positive ETCO2 and breath sounds checked- equal and bilateral Secured at: 21 cm Tube secured with: Tape Dental Injury: Teeth and Oropharynx as per pre-operative assessment  Comments: Atraumatic induction/intubation. Dentition and oral mucosa as per preop. Grade 2 view with Mac 3 and BURP

## 2023-08-28 NOTE — Transfer of Care (Signed)
 Immediate Anesthesia Transfer of Care Note  Patient: Misty Clarke  Procedure(s) Performed: Pipeline Embolization of Aneurysm  Patient Location: PACU  Anesthesia Type:General  Level of Consciousness: awake, alert , and patient cooperative  Airway & Oxygen Therapy: Patient Spontanous Breathing and Patient connected to face mask oxygen  Post-op Assessment: Report given to RN and Post -op Vital signs reviewed and stable  Post vital signs: Reviewed and stable  Last Vitals:  Vitals Value Taken Time  BP 122/82 08/28/23 1115  Temp 36.3 C 08/28/23 1115  Pulse 92 08/28/23 1117  Resp 17 08/28/23 1117  SpO2 100 % 08/28/23 1117  Vitals shown include unfiled device data.  Last Pain:  Vitals:   08/28/23 0653  PainSc: 0-No pain         Complications: No notable events documented.

## 2023-08-28 NOTE — Anesthesia Postprocedure Evaluation (Signed)
 Anesthesia Post Note  Patient: Misty Clarke  Procedure(s) Performed: Pipeline Embolization of Aneurysm     Patient location during evaluation: PACU Anesthesia Type: General Level of consciousness: awake and alert Pain management: pain level controlled Vital Signs Assessment: post-procedure vital signs reviewed and stable Respiratory status: spontaneous breathing, nonlabored ventilation and respiratory function stable Cardiovascular status: stable and blood pressure returned to baseline Anesthetic complications: no   No notable events documented.  Last Vitals:  Vitals:   08/28/23 1300 08/28/23 1330  BP: 119/74 135/80  Pulse: 77 73  Resp: 12 17  Temp: 36.5 C   SpO2: 100% 98%    Last Pain:  Vitals:   08/28/23 1330  PainSc: 0-No pain                 Beryle Lathe

## 2023-08-28 NOTE — Anesthesia Procedure Notes (Signed)
 Arterial Line Insertion Start/End2/28/2025 7:20 AM, 08/28/2023 7:26 AM Performed by: Beryle Lathe, MD, Darlina Guys, CRNA, CRNA  Patient location: Pre-op. Preanesthetic checklist: patient identified, IV checked, site marked, risks and benefits discussed, surgical consent, monitors and equipment checked, pre-op evaluation, timeout performed and anesthesia consent Lidocaine 1% used for infiltration radial was placed Catheter size: 20 G Hand hygiene performed  and Seldinger technique used  Attempts: 2 Procedure performed using ultrasound guided technique. Ultrasound Notes:anatomy identified, needle tip was noted to be adjacent to the nerve/plexus identified and no ultrasound evidence of intravascular and/or intraneural injection Following insertion, Biopatch and dressing applied. Post procedure assessment: normal  Patient tolerated the procedure well with no immediate complications.

## 2023-08-28 NOTE — H&P (Signed)
 Chief Complaint   Aneurysm  History of Present Illness  Misty Clarke is a 58 year old woman seen in follow-up. 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 She made an excellent neurologic recovery. She recently underwent routine follow-up angiogram demonstrating recanalization. She therefore presents for Pipeline embolization. She has been pre-treated with ASA/Brilinta.  Past Medical History   Past Medical History:  Diagnosis Date   Aneurysm (HCC)    01-2023   Hyperlipidemia     Past Surgical History   Past Surgical History:  Procedure Laterality Date   BREAST EXCISIONAL BIOPSY Right    IR ANGIO INTRA EXTRACRAN SEL INTERNAL CAROTID BILAT MOD SED  02/07/2023   IR ANGIO INTRA EXTRACRAN SEL INTERNAL CAROTID UNI L MOD SED  07/17/2023   IR ANGIO VERTEBRAL SEL VERTEBRAL UNI L MOD SED  02/07/2023   IR ANGIOGRAM FOLLOW UP STUDY  02/07/2023   IR ANGIOGRAM FOLLOW UP STUDY  02/07/2023   IR NEURO EACH ADD'L AFTER BASIC UNI LEFT (MS)  02/07/2023   IR TRANSCATH/EMBOLIZ  02/07/2023   IR US GUIDE VASC ACCESS RIGHT  07/17/2023   RADIOLOGY WITH ANESTHESIA N/A 02/07/2023   Procedure: IR WITH ANESTHESIA;  Surgeon: [name], MD;  [facility] OR;  Service: Radiology;  Laterality: N/A;    Social History   Social History   Tobacco Use   Smoking status: Never   Smokeless tobacco: Never  Vaping Use   Vaping status: Never Used  Substance Use Topics   Alcohol use: Yes    Comment: rare   Drug use: No    Medications   Prior to Admission medications   Medication Sig Start Date End Date Taking? Authorizing Provider  acetaminophen (TYLENOL) 500 MG tablet Take 500 mg by mouth every 6 (six) hours as needed.   Yes [provider]  aspirin EC 81 MG tablet Take 81 mg by mouth daily. Swallow whole.   Yes [provider]  atorvastatin (LIPITOR) 40 MG tablet Take 1 tablet (40 mg total) by  mouth daily. 06/12/23  Yes [name], MD  BRILINTA 90 MG TABS tablet Take 90 mg by mouth 2 (two) times daily. 07/27/23  Yes [provider]  docusate sodium (COLACE) 100 MG capsule Take 100 mg by mouth 2 (two) times daily.   Yes [provider]  ibuprofen (ADVIL) 200 MG tablet Take 400 mg by mouth every 6 (six) hours as needed for headache, mild pain (pain score 1-3) or moderate pain (pain score 4-6).   Yes [provider]  Multiple Vitamin (MULTIVITAMIN) tablet Take 1 tablet by mouth daily.   Yes [provider]  niMODipine (NIMOTOP) 30 MG capsule Take 2 capsules (60 mg total) by mouth every 4 (four) hours for 11 days. Patient not taking: Reported on 08/20/2023 02/17/23 06/11/23  [name], MD    Allergies   Allergies  Allergen Reactions   Penicillins Other (See Comments)    Joint pain    Review of Systems  ROS  Neurologic Exam  Awake, alert, oriented Memory and concentration grossly intact Speech fluent, appropriate CN grossly intact Motor exam: Upper Extremities Deltoid Bicep Tricep Grip  Right 5/5 5/5 5/5 5/5  Left 5/5 5/5 5/5 5/5   Lower Extremities IP Quad PF DF EHL  Right 5/5 5/5 5/5 5/5 5/5  Left 5/5 5/5 5/5 5/5 5/5   Sensation grossly intact to LT  Imaging  Angiogram demonstrates neck recanalization/compaction  of the Left SHA aneurysm.  Impression  - 58 y.o. female 45mo s/p SAH with recanalization of coiled left SAH aneurysm.  Plan  - Will proceed with pipeline embolization  I have reviewed the indications for the procedure as well as the details of the procedure and the expected postoperative course and recovery at length with the patient in the office. We have also reviewed in detail the risks, benefits, and alternatives to the procedure. All questions were answered and [name] provided informed consent to proceed.  [name], MD [facility]

## 2023-08-28 NOTE — Op Note (Signed)
  NEUROSURGERY BRIEF OPERATIVE  NOTE   PREOP DX: LICA aneurysm  POSTOP DX: Same  PROCEDURE:Pipeline Embolization of LICA Aneurysm  SURGEON: Dr. [name], MD  ANESTHESIA: GETA  APPROACH: Right trans-femoral  EBL: Minimal  SPECIMENS: None  COMPLICATIONS: None  CONDITION: Stable to recovery  FINDINGS (Full report in [facility]): 1. Successful Pipeline embolization of LICA aneurysm   [name], MD [facility]

## 2023-08-29 DIAGNOSIS — I10 Essential (primary) hypertension: Secondary | ICD-10-CM | POA: Diagnosis not present

## 2023-08-29 DIAGNOSIS — Z7982 Long term (current) use of aspirin: Secondary | ICD-10-CM | POA: Diagnosis not present

## 2023-08-29 DIAGNOSIS — Z79899 Other long term (current) drug therapy: Secondary | ICD-10-CM | POA: Diagnosis not present

## 2023-08-29 DIAGNOSIS — Z7902 Long term (current) use of antithrombotics/antiplatelets: Secondary | ICD-10-CM | POA: Diagnosis not present

## 2023-08-29 DIAGNOSIS — I720 Aneurysm of carotid artery: Secondary | ICD-10-CM | POA: Diagnosis not present

## 2023-08-29 NOTE — Discharge Summary (Signed)
  Physician Discharge Summary  Patient ID: Misty Clarke MRN: [id number] DOB/AGE: 1966-05-05 58 y.o. Estimated body mass index is 28.43 kg/m as calculated from the following:   Height as of this encounter: 5\' 8"  (1.727 m).   Weight as of this encounter: 84.8 kg.   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  Postoperative patient did fairly well had a little bit of oozing from her groin site this pressure was held here and stopped patient did well overnight was mobilized in the morning and stable for discharge home.  Patient will be discharged scheduled follow-up with Dr. Conchita Paris in 1 to 2 weeks.  Consults: Significant Diagnostic Studies: Treatments: Pipeline stenting of his left internal carotid artery aneurysm Discharge Exam: Blood pressure (!) 90/57, pulse 66, temperature 97.7 F (36.5 C), temperature source Oral, resp. rate 19, height 5\' 8"  (1.727 m), weight 84.8 kg, last menstrual period 03/06/2018, SpO2 98%. Awake alert neurologically nonfocal groin site clean and dry  Disposition: Home   Allergies as of 08/29/2023       Reactions   Penicillins Other (See Comments)   Joint pain        Medication List     TAKE these medications    acetaminophen 500 MG tablet Commonly known as: TYLENOL Take 500 mg by mouth every 6 (six) hours as needed.   aspirin EC 81 MG tablet Take 81 mg by mouth daily. Swallow whole.   atorvastatin 40 MG tablet Commonly known as: LIPITOR Take 1 tablet (40 mg total) by mouth daily.   Brilinta 90 MG Tabs tablet Generic drug: ticagrelor Take 90 mg by mouth 2 (two) times daily.   docusate sodium  100 MG capsule Commonly known as: COLACE Take 100 mg by mouth 2 (two) times daily.   ibuprofen 200 MG tablet Commonly known as: ADVIL Take 400 mg by mouth every 6 (six) hours as needed for headache, mild pain (pain score 1-3) or moderate pain (pain score 4-6).   multivitamin tablet Take 1 tablet by mouth daily.         Signed: Mariam Dollar 08/29/2023, 8:21 AM

## 2023-08-29 NOTE — Progress Notes (Signed)
Foley removed at 0515.

## 2023-08-29 NOTE — Progress Notes (Signed)
 Patient ID: Misty Clarke, female   DOB: 1966/04/02, 58 y.o.   MRN: [id number] Patient status post pipeline stenting doing fine groin access site small amount of subcutaneous hematoma but no longer draining stable mobilized along the floor discharge later this morning

## 2023-08-31 ENCOUNTER — Encounter (HOSPITAL_COMMUNITY): Payer: Self-pay | Admitting: Neurosurgery

## 2023-09-10 DIAGNOSIS — I609 Nontraumatic subarachnoid hemorrhage, unspecified: Secondary | ICD-10-CM | POA: Diagnosis not present

## 2023-09-11 ENCOUNTER — Other Ambulatory Visit: Payer: BC Managed Care – PPO

## 2023-09-11 DIAGNOSIS — Z23 Encounter for immunization: Secondary | ICD-10-CM

## 2023-11-24 ENCOUNTER — Ambulatory Visit: Admitting: Medical

## 2023-11-24 VITALS — BP 110/72 | HR 72 | Temp 97.7°F | Wt 188.2 lb

## 2023-11-24 DIAGNOSIS — H93A9 Pulsatile tinnitus, unspecified ear: Secondary | ICD-10-CM | POA: Diagnosis not present

## 2023-11-24 DIAGNOSIS — Z9889 Other specified postprocedural states: Secondary | ICD-10-CM | POA: Diagnosis not present

## 2023-11-24 DIAGNOSIS — I671 Cerebral aneurysm, nonruptured: Secondary | ICD-10-CM

## 2023-11-24 DIAGNOSIS — I10 Essential (primary) hypertension: Secondary | ICD-10-CM | POA: Diagnosis not present

## 2023-11-24 DIAGNOSIS — E785 Hyperlipidemia, unspecified: Secondary | ICD-10-CM

## 2023-11-24 NOTE — Progress Notes (Signed)
 Subjective:  Misty Clarke is a 58 y.o. female who presents for Chief Complaint  Patient presents with   Acute Visit    Hears wooshing sound in her ears for 3 weeks. Thought it would go away. No pain     Here for whooshing sound in ears.  Been having this for 3 weeks.  Mainly notices if its quiet in the room or at night going to bed.   Otherwise no issue.  No recent allergy problems.  No hx/o HTN.  She does have hx/o cerebral aneurysm with prior coil embolization.   Otherwise in normal state of health. Not around loud noises in general.  Works at the [employer].  No other aggravating or relieving factors.    No other c/o.  Past Medical History:  Diagnosis Date   Aneurysm (HCC)    01-2023   Hyperlipidemia    Current Outpatient Medications on File Prior to Visit  Medication Sig Dispense Refill   acetaminophen  (TYLENOL ) 500 MG tablet Take 500 mg by mouth every 6 (six) hours as needed.     aspirin  EC 81 MG tablet Take 81 mg by mouth daily. Swallow whole.     atorvastatin  (LIPITOR) 40 MG tablet Take 1 tablet (40 mg total) by mouth daily. 90 tablet 3   BRILINTA  90 MG TABS tablet Take 90 mg by mouth 2 (two) times daily.     docusate sodium  (COLACE) 100 MG capsule Take 100 mg by mouth 2 (two) times daily.     ibuprofen (ADVIL) 200 MG tablet Take 400 mg by mouth every 6 (six) hours as needed for headache, mild pain (pain score 1-3) or moderate pain (pain score 4-6).     Multiple Vitamin (MULTIVITAMIN) tablet Take 1 tablet by mouth daily.     No current facility-administered medications on file prior to visit.    The following portions of the patient's history were reviewed and updated as appropriate: allergies, current medications, past family history, past medical history, past social history, past surgical history and problem list.  ROS Otherwise as in subjective above    Objective: BP 110/72   Pulse 72   Temp 97.7 F (36.5 C)   Wt 188 lb 3.2 oz (85.4 kg)   LMP 03/06/2018   BMI 28.62  kg/m   General appearance: alert, no distress, well developed, well nourished HEENT: normocephalic, sclerae anicteric, conjunctiva pink and moist, TMs flat, nares patent, no discharge or erythema, pharynx normal Oral cavity: MMM, no lesions Neck: supple, no lymphadenopathy, no thyromegaly, no masses, no bruits Heart: RRR, normal S1, S2, no murmurs Lungs: CTA bilaterally, no wheezes, rhonchi, or rales Pulses: 2+ radial pulses, 2+ pedal pulses, normal cap refill Ext: no edema Neuro: CN2-12 intact, nonfocal exam    Assessment: Encounter Diagnoses  Name Primary?   Pulsatile tinnitus Yes   Aneurysm, cerebral, nonruptured    Hyperlipidemia, unspecified hyperlipidemia type    Status post coil embolization of cerebral aneurysm      Plan: We discussed differential, possible causes of the tinnitus.  She notes that she spoke to neurosurgery and they felt this wasn't related to her vascular blood flow or hx/o emobolization of prior aneurysm.     Ear drums suggest fluid in eustachian tubes.  Advised benadryl  at night or mucinex plain for the next 1-2 weeks.  Labs for thyroid today.   If not improving over the next 1-2 weeks, then call back and we can refer to ENT.   Misty Clarke was seen today for  acute visit.  Diagnoses and all orders for this visit:  Pulsatile tinnitus -     TSH + free T4  Aneurysm, cerebral, nonruptured  Hyperlipidemia, unspecified hyperlipidemia type  Status post coil embolization of cerebral aneurysm    Follow up: pending labs

## 2023-11-25 ENCOUNTER — Ambulatory Visit: Payer: Self-pay | Admitting: Medical

## 2023-11-25 LAB — TSH+FREE T4
Free T4: 0.96 ng/dL (ref 0.82–1.77)
TSH: 1.35 u[IU]/mL (ref 0.450–4.500)

## 2023-11-25 NOTE — Progress Notes (Signed)
 Results sent through MyChart

## 2023-12-01 ENCOUNTER — Ambulatory Visit: Admitting: Family Medicine

## 2024-01-22 DIAGNOSIS — H5213 Myopia, bilateral: Secondary | ICD-10-CM | POA: Diagnosis not present

## 2024-01-22 DIAGNOSIS — H524 Presbyopia: Secondary | ICD-10-CM | POA: Diagnosis not present

## 2024-02-18 ENCOUNTER — Other Ambulatory Visit: Payer: Self-pay | Admitting: Neurosurgery

## 2024-02-18 DIAGNOSIS — I609 Nontraumatic subarachnoid hemorrhage, unspecified: Secondary | ICD-10-CM

## 2024-02-18 DIAGNOSIS — I671 Cerebral aneurysm, nonruptured: Secondary | ICD-10-CM

## 2024-03-18 ENCOUNTER — Ambulatory Visit (HOSPITAL_COMMUNITY)
Admission: RE | Admit: 2024-03-18 | Discharge: 2024-03-18 | Disposition: A | Discharge status: Home / Self Care | Source: Ambulatory Visit | Attending: Neurosurgery | Admitting: Neurosurgery

## 2024-03-18 ENCOUNTER — Other Ambulatory Visit: Payer: Self-pay | Admitting: Neurosurgery

## 2024-03-18 ENCOUNTER — Other Ambulatory Visit: Payer: Self-pay

## 2024-03-18 DIAGNOSIS — I671 Cerebral aneurysm, nonruptured: Secondary | ICD-10-CM | POA: Insufficient documentation

## 2024-03-18 DIAGNOSIS — I609 Nontraumatic subarachnoid hemorrhage, unspecified: Secondary | ICD-10-CM

## 2024-03-18 DIAGNOSIS — Z7982 Long term (current) use of aspirin: Secondary | ICD-10-CM | POA: Diagnosis not present

## 2024-03-18 DIAGNOSIS — Z7902 Long term (current) use of antithrombotics/antiplatelets: Secondary | ICD-10-CM | POA: Diagnosis not present

## 2024-03-18 HISTORY — PX: IR US GUIDE VASC ACCESS RIGHT: IMG2390

## 2024-03-18 HISTORY — PX: IR ANGIO INTRA EXTRACRAN SEL COM CAROTID INNOMINATE UNI L MOD SED: IMG5358

## 2024-03-18 LAB — BASIC METABOLIC PANEL WITH GFR
Anion gap: 12 (ref 5–15)
BUN: 13 mg/dL (ref 6–20)
CO2: 24 mmol/L (ref 22–32)
Calcium: 9.3 mg/dL (ref 8.9–10.3)
Chloride: 104 mmol/L (ref 98–111)
Creatinine, Ser: 0.99 mg/dL (ref 0.44–1.00)
GFR, Estimated: >60 mL/min (ref >60)
Glucose, Bld: 100 mg/dL — ABNORMAL HIGH (ref 70–99)
Potassium: 4.6 mmol/L (ref 3.5–5.1)
Sodium: 140 mmol/L (ref 135–145)

## 2024-03-18 LAB — URINALYSIS, ROUTINE W REFLEX MICROSCOPIC
Bilirubin Urine: NEGATIVE
Glucose, UA: NEGATIVE mg/dL
Hgb urine dipstick: NEGATIVE
Ketones, ur: NEGATIVE mg/dL
Leukocytes,Ua: NEGATIVE
Nitrite: NEGATIVE
Protein, ur: NEGATIVE mg/dL
Specific Gravity, Urine: 1.018 (ref 1.005–1.030)
pH: 6 (ref 5.0–8.0)

## 2024-03-18 LAB — CBC WITH DIFFERENTIAL/PLATELET
Abs Immature Granulocytes: 0.01 K/uL (ref 0.00–0.07)
Basophils Absolute: 0.1 K/uL (ref 0.0–0.1)
Basophils Relative: 1 %
Eosinophils Absolute: 0.1 K/uL (ref 0.0–0.5)
Eosinophils Relative: 3 %
HCT: 40.7 % (ref 36.0–46.0)
Hemoglobin: 13.3 g/dL (ref 12.0–15.0)
Immature Granulocytes: 0 %
Lymphocytes Relative: 43 %
Lymphs Abs: 2.1 K/uL (ref 0.7–4.0)
MCH: 29.4 pg (ref 26.0–34.0)
MCHC: 32.7 g/dL (ref 30.0–36.0)
MCV: 90 fL (ref 80.0–100.0)
Monocytes Absolute: 0.5 K/uL (ref 0.1–1.0)
Monocytes Relative: 10 %
Neutro Abs: 2.2 K/uL (ref 1.7–7.7)
Neutrophils Relative %: 43 %
Platelets: 235 K/uL (ref 150–400)
RBC: 4.52 MIL/uL (ref 3.87–5.11)
RDW: 14.4 % (ref 11.5–15.5)
WBC: 4.9 K/uL (ref 4.0–10.5)
nRBC: 0 % (ref 0.0–0.2)

## 2024-03-18 LAB — PROTIME-INR
INR: 0.9 (ref 0.8–1.2)
Prothrombin Time: 12.9 s (ref 11.4–15.2)

## 2024-03-18 LAB — APTT: aPTT: 25 s (ref 24–36)

## 2024-03-18 MED ORDER — IOHEXOL 300 MG/ML  SOLN
100.0000 mL | Freq: Once | INTRAMUSCULAR | Status: AC | PRN
  Administered 2024-03-18: 55 mL via INTRA_ARTERIAL

## 2024-03-18 MED ORDER — VERAPAMIL HCL 2.5 MG/ML IV SOLN
INTRAVENOUS | Status: AC
  Filled 2024-03-18: qty 2

## 2024-03-18 MED ORDER — VANCOMYCIN HCL IN DEXTROSE 1-5 GM/200ML-% IV SOLN: 1000.0000 mg | INTRAVENOUS | Status: DC

## 2024-03-18 MED ORDER — VERAPAMIL HCL 2.5 MG/ML IV SOLN: INTRA_ARTERIAL | Status: AC | PRN

## 2024-03-18 MED ORDER — CHLORHEXIDINE GLUCONATE CLOTH 2 % EX PADS: 6.0000 | MEDICATED_PAD | Freq: Once | CUTANEOUS | Status: DC

## 2024-03-18 MED ORDER — MIDAZOLAM HCL 2 MG/2ML IJ SOLN
INTRAMUSCULAR | Status: AC
  Filled 2024-03-18: qty 2

## 2024-03-18 MED ORDER — LIDOCAINE HCL 1 % IJ SOLN
INTRAMUSCULAR | Status: AC
  Filled 2024-03-18: qty 20

## 2024-03-18 MED ORDER — LIDOCAINE HCL 1 % IJ SOLN: 20.0000 mL | Freq: Once | INTRAMUSCULAR | Status: DC

## 2024-03-18 MED ORDER — HEPARIN SODIUM (PORCINE) 1000 UNIT/ML IJ SOLN
INTRAMUSCULAR | Status: AC | PRN
  Administered 2024-03-18: 3000 [IU] via INTRAVENOUS

## 2024-03-18 MED ORDER — FENTANYL CITRATE (PF) 100 MCG/2ML IJ SOLN
INTRAMUSCULAR | Status: AC
  Filled 2024-03-18: qty 2

## 2024-03-18 MED ORDER — NITROGLYCERIN 1 MG/10 ML FOR IR/CATH LAB
INTRA_ARTERIAL | Status: AC
  Filled 2024-03-18: qty 10

## 2024-03-18 MED ORDER — NITROGLYCERIN 1 MG/10 ML FOR IR/CATH LAB: INTRA_ARTERIAL | Status: AC | PRN

## 2024-03-18 MED ORDER — HEPARIN SODIUM (PORCINE) 1000 UNIT/ML IJ SOLN
INTRAMUSCULAR | Status: AC
  Filled 2024-03-18: qty 10

## 2024-03-18 MED ORDER — MIDAZOLAM HCL 2 MG/2ML IJ SOLN
INTRAMUSCULAR | Status: AC | PRN
  Administered 2024-03-18: 1 mg via INTRAVENOUS

## 2024-03-18 MED ORDER — FENTANYL CITRATE (PF) 100 MCG/2ML IJ SOLN
INTRAMUSCULAR | Status: AC | PRN
  Administered 2024-03-18: 25 ug via INTRAVENOUS

## 2024-03-18 NOTE — H&P (Addendum)
  HPI:     Misty Clarke is a 58 year old woman I am seeing 6 months after undergoing pipeline embolization of a recanalized left carotid aneurysm, which was approximately six months after subarachnoid hemorrhage.  She has done well neurologically.  She has no significant headache.  She remains on daily aspirin  and twice Daily Brilinta .   Patient Active Problem List   Diagnosis Date Noted   Status post coil embolization of cerebral aneurysm 08/28/2023   Aneurysm, cerebral, nonruptured 08/28/2023   Hypertension 08/28/2023   Subarachnoid hemorrhage (HCC) 02/07/2023   Hyperlipidemia 05/27/2022   Family history of diabetes mellitus in father 05/08/2020   Menopausal syndrome 05/08/2020   Postcoital bleeding 05/08/2020   Allergic rhinitis, mild 07/23/2011   Family history of heart disease in female family member before age 35 07/23/2011   Past Medical History:  Diagnosis Date   Aneurysm (HCC)    01-2023   Hyperlipidemia     Past Surgical History:  Procedure Laterality Date   BREAST EXCISIONAL BIOPSY Right    IR ANGIO INTRA EXTRACRAN SEL INTERNAL CAROTID BILAT MOD SED  02/07/2023   IR ANGIO INTRA EXTRACRAN SEL INTERNAL CAROTID UNI L MOD SED  07/17/2023   IR ANGIO INTRA EXTRACRAN SEL INTERNAL CAROTID UNI L MOD SED  08/28/2023   IR ANGIO VERTEBRAL SEL VERTEBRAL UNI L MOD SED  02/07/2023   IR ANGIOGRAM FOLLOW UP STUDY  02/07/2023   IR ANGIOGRAM FOLLOW UP STUDY  02/07/2023   IR ANGIOGRAM FOLLOW UP STUDY  08/28/2023   IR NEURO EACH ADD'L AFTER BASIC UNI LEFT (MS)  02/07/2023   IR NEURO EACH ADD'L AFTER BASIC UNI LEFT (MS)  08/28/2023   IR TRANSCATH/EMBOLIZ  02/07/2023   IR TRANSCATH/EMBOLIZ  08/28/2023   IR US  GUIDE VASC ACCESS RIGHT  07/17/2023   RADIOLOGY WITH ANESTHESIA N/A 02/07/2023   Procedure: IR WITH ANESTHESIA;  Surgeon: Lanis Pupa, MD;  Location: [facility] OR;  Service: Radiology;  Laterality: N/A;   RADIOLOGY WITH ANESTHESIA N/A 08/28/2023   Procedure: Pipeline Embolization of Aneurysm;   Surgeon: Lanis Pupa, MD;  Location: [facility] OR;  Service: Radiology;  Laterality: N/A;    (Not in a hospital admission)  Allergies  Allergen Reactions   Penicillins Other (See Comments)    Joint pain    Social History   Tobacco Use   Smoking status: Never   Smokeless tobacco: Never  Substance Use Topics   Alcohol use: Yes    Comment: rare    Family History  Problem Relation Age of Onset   Diabetes Father    Hypertension Father    Heart disease Father 19       MI   Diabetes Paternal Aunt    Hypertension Paternal Uncle    Stroke Paternal Uncle    Diabetes Paternal Grandfather    Breast cancer Neg Hx    Colon cancer Neg Hx    Colon polyps Neg Hx    Esophageal cancer Neg Hx    Rectal cancer Neg Hx    Stomach cancer Neg Hx      Objective:   Awake, alert, oriented Speech fluent, appropriate CN grossly intact 5/5 BUE/BLE    Assessment:    58 year old woman 57mo s/p Pipeline embolization of a recanalized LICA aneurysm   Plan:   - angiographic follow-up today    Pupa Lanis, MD [facility]

## 2024-03-18 NOTE — Progress Notes (Signed)
 During air removal on the TR band the incision site began to bleed. The air was placed back in and the site continued to bleed. Pressure was held for 12 minutes after pressure was held pt became hot diaphoretic, and felt as if she were going to pass out. Head of bed was placed flat feet were elevated. Vitals signs were obtained and patient was noted to be hypotensive. Fluid bolus of 500 was administered. PT vitals returned to baseline. PT began to feel at baseline. Began removing air at 2cc per 15 minutes until band was empty. PT then ambulated in the hallway, no complaints of dizziness, PT was able to void in the bathroom without difficulty. Tolerated po intake. Discharge instructions reviewed with patient and husband at bedside.  PT escorted from the unit via wheel chair to personal vehicle.

## 2024-03-18 NOTE — Op Note (Signed)
  ENDOVASCULAR NEUROSURGERY OPERATIVE NOTE   PROCEDURE: Diagnostic Cerebral Angiogram   SURGEON:   Dr. Gerldine Maizes, MD  HISTORY:   The patient is a 58 y.o. yo female with a history of subarachnoid hemorrhage approximately 1 year ago at which time she underwent coil embolization of a posterior communicating artery aneurysm on the left side.  She made an excellent neurologic recovery, with follow-up angiogram revealing recanalization of the aneurysm.  She subsequently underwent pipeline embolization about 6 months ago.  She has again done well from a neurologic standpoint.  She comes in today for routine short-term angiographic follow-up.  APPROACH:   The technical aspects of the procedure as well as its potential risks and benefits were reviewed with the patient. These risks included but were not limited bleeding, infection, allergic reaction, damage to organs/vital structures, stroke, non-diagnostic procedure, and the catastrophic outcomes of heart attack, coma, and death. With an understanding of these risks, informed consent was obtained and witnessed.    The patient was placed in the supine position on the angiography table and the skin of right groin prepped in the usual sterile fashion. The procedure was performed under local anesthesia (1%-solution of bicarbonate-bufferred Lidoacaine) and conscious sedation with Versed  and fentanyl  monitored by the in-suite nurse and myself, including non-invasive blood pressure and continuous pulse oxymetry.    Access to the right radial artery was obtained under ultrasound guidance using standard Seldinger technique.  This allowed direct visualization of the micropuncture needle into the lumen of the right radial artery.  A short 5 French glide slender sheath was placed.  MEDICATION:  3000 Units Heparin  2.5mg  Verapamil  100mcg Nitroglycerin     CONTRAST AGENT:  See IR records   FLUOROSCOPY TIME:  See IR records    CATHETER(S) AND WIRE(S):     5-French Simmons 2 glidecatheter   0.035" glidewire    VESSELS CATHETERIZED:    Left common carotid   Right common femoral  VESSELS STUDIED:   Left common carotid, head Right femoral  PROCEDURAL NARRATIVE:   A 5-Fr Simmons 2 terumo glide catheter was advanced over a 0.035 glidewire into the aortic arch.  Secondary curve was reformed.  Left common carotid artery was then catheterized.  Cerebral angiogram was taken.. After review of images, the catheter was removed without incident.    INTERPRETATION:   Left common carotid, head:   Injection reveals the presence of a widely patent ICA, A1, and M1 segments and their branches.  Pipeline device is seen extending throughout the supraclinoid internal carotid artery.  Coil mass is also again seen.  Previously seen aneurysm in the region of the posterior communicating artery is no longer visualized however there is moderate stenosis within the lumen of the pipeline device.  There is no flow limitation.  Capillary phase does not demonstrate any branch occlusions or perfusion deficits.  Right radial:    Normal vessel. No significant atherosclerotic disease. Arterial sheath in adequate position.   DISPOSITION:  Upon completion of the study, the ideal sheath was removed and hemostasis obtained using a Terumo TR band.  The procedure was well tolerated and no early complications were observed.  The patient was transferred to the recovery area for further care.    IMPRESSION:  1.  Complete occlusion of the previously described left posterior communicating artery aneurysm recanalization.  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    Gerldine Maizes, MD [facility]

## 2024-03-18 NOTE — Discharge Instructions (Signed)

## 2024-03-23 ENCOUNTER — Other Ambulatory Visit: Payer: Self-pay | Admitting: Neurosurgery

## 2024-03-23 ENCOUNTER — Encounter (HOSPITAL_COMMUNITY): Payer: Self-pay

## 2024-03-23 DIAGNOSIS — I609 Nontraumatic subarachnoid hemorrhage, unspecified: Secondary | ICD-10-CM

## 2024-03-28 DIAGNOSIS — Z6828 Body mass index (BMI) 28.0-28.9, adult: Secondary | ICD-10-CM | POA: Diagnosis not present

## 2024-03-28 DIAGNOSIS — I609 Nontraumatic subarachnoid hemorrhage, unspecified: Secondary | ICD-10-CM | POA: Diagnosis not present

## 2024-05-19 ENCOUNTER — Other Ambulatory Visit: Payer: Self-pay | Admitting: Family Medicine

## 2024-05-19 DIAGNOSIS — Z1231 Encounter for screening mammogram for malignant neoplasm of breast: Secondary | ICD-10-CM

## 2024-06-16 DIAGNOSIS — R87610 Atypical squamous cells of undetermined significance on cytologic smear of cervix (ASC-US): Secondary | ICD-10-CM | POA: Diagnosis not present

## 2024-06-16 DIAGNOSIS — R875 Abnormal microbiological findings in specimens from female genital organs: Secondary | ICD-10-CM | POA: Diagnosis not present

## 2024-06-16 DIAGNOSIS — N760 Acute vaginitis: Secondary | ICD-10-CM | POA: Diagnosis not present

## 2024-06-16 DIAGNOSIS — B9689 Other specified bacterial agents as the cause of diseases classified elsewhere: Secondary | ICD-10-CM | POA: Diagnosis not present

## 2024-06-16 DIAGNOSIS — Z8742 Personal history of other diseases of the female genital tract: Secondary | ICD-10-CM | POA: Diagnosis not present

## 2024-06-16 DIAGNOSIS — Z01411 Encounter for gynecological examination (general) (routine) with abnormal findings: Secondary | ICD-10-CM | POA: Diagnosis not present

## 2024-07-04 ENCOUNTER — Ambulatory Visit
Admission: RE | Admit: 2024-07-04 | Discharge: 2024-07-04 | Disposition: A | Discharge status: Home / Self Care | Source: Ambulatory Visit | Attending: Family Medicine | Admitting: Family Medicine

## 2024-07-04 DIAGNOSIS — Z1231 Encounter for screening mammogram for malignant neoplasm of breast: Secondary | ICD-10-CM

## 2024-07-18 ENCOUNTER — Ambulatory Visit: Admitting: Family Medicine

## 2024-07-18 VITALS — BP 116/80 | HR 79 | Ht 68.0 in | Wt 191.4 lb

## 2024-07-18 DIAGNOSIS — Z23 Encounter for immunization: Secondary | ICD-10-CM

## 2024-07-18 DIAGNOSIS — I671 Cerebral aneurysm, nonruptured: Secondary | ICD-10-CM | POA: Diagnosis not present

## 2024-07-18 DIAGNOSIS — E782 Mixed hyperlipidemia: Secondary | ICD-10-CM | POA: Diagnosis not present

## 2024-07-18 DIAGNOSIS — Z0000 Encounter for general adult medical examination without abnormal findings: Secondary | ICD-10-CM | POA: Diagnosis not present

## 2024-07-18 DIAGNOSIS — Z1211 Encounter for screening for malignant neoplasm of colon: Secondary | ICD-10-CM | POA: Diagnosis not present

## 2024-07-18 DIAGNOSIS — E785 Hyperlipidemia, unspecified: Secondary | ICD-10-CM

## 2024-07-18 LAB — CBC WITH DIFFERENTIAL/PLATELET
Basophils Absolute: 0.1 x10E3/uL (ref 0.0–0.2)
Basos: 1 %
EOS (ABSOLUTE): 0.1 x10E3/uL (ref 0.0–0.4)
Eos: 2 %
Hematocrit: 42.5 % (ref 34.0–46.6)
Hemoglobin: 13.4 g/dL (ref 11.1–15.9)
Immature Grans (Abs): 0 x10E3/uL (ref 0.0–0.1)
Immature Granulocytes: 0 %
Lymphocytes Absolute: 1.9 x10E3/uL (ref 0.7–3.1)
Lymphs: 37 %
MCH: 28.9 pg (ref 26.6–33.0)
MCHC: 31.5 g/dL (ref 31.5–35.7)
MCV: 92 fL (ref 79–97)
Monocytes Absolute: 0.4 x10E3/uL (ref 0.1–0.9)
Monocytes: 9 %
Neutrophils Absolute: 2.6 x10E3/uL (ref 1.4–7.0)
Neutrophils: 51 %
Platelets: 310 x10E3/uL (ref 150–450)
RBC: 4.63 x10E6/uL (ref 3.77–5.28)
RDW: 13.1 % (ref 11.7–15.4)
WBC: 5 x10E3/uL (ref 3.4–10.8)

## 2024-07-18 LAB — COMPREHENSIVE METABOLIC PANEL WITH GFR
ALT: 45 IU/L — ABNORMAL HIGH (ref 0–32)
AST: 34 IU/L (ref 0–40)
Albumin: 4.7 g/dL (ref 3.8–4.9)
Alkaline Phosphatase: 121 IU/L (ref 49–135)
BUN/Creatinine Ratio: 12 (ref 9–23)
BUN: 12 mg/dL (ref 6–24)
Bilirubin Total: 1.2 mg/dL (ref 0.0–1.2)
CO2: 25 mmol/L (ref 20–29)
Calcium: 9.7 mg/dL (ref 8.7–10.2)
Chloride: 103 mmol/L (ref 96–106)
Creatinine, Ser: 1.02 mg/dL — ABNORMAL HIGH (ref 0.57–1.00)
Globulin, Total: 2.5 g/dL (ref 1.5–4.5)
Glucose: 84 mg/dL (ref 70–99)
Potassium: 4.3 mmol/L (ref 3.5–5.2)
Sodium: 141 mmol/L (ref 134–144)
Total Protein: 7.2 g/dL (ref 6.0–8.5)
eGFR: 64 mL/min/1.73

## 2024-07-18 LAB — LIPID PANEL
Chol/HDL Ratio: 2.4 ratio (ref 0.0–4.4)
Cholesterol, Total: 138 mg/dL (ref 100–199)
HDL: 58 mg/dL
LDL Chol Calc (NIH): 65 mg/dL (ref 0–99)
Triglycerides: 75 mg/dL (ref 0–149)
VLDL Cholesterol Cal: 15 mg/dL (ref 5–40)

## 2024-07-18 MED ORDER — ATORVASTATIN CALCIUM 40 MG PO TABS: 40.0000 mg | ORAL_TABLET | Freq: Every day | ORAL | 3 refills | Status: AC

## 2024-07-18 NOTE — Progress Notes (Signed)
" ° °  Name: Misty Clarke   Date of Visit: 07/18/24   Date of last visit with me: Visit date not found   CHIEF COMPLAINT:  Chief Complaint  Patient presents with   Annual Exam    Cpe. No questions or concerns.        HPI:  Discussed the use of AI scribe software for clinical note transcription with the patient, who gave verbal consent to proceed.  History of Present Illness   Misty Clarke is a 59 year old female who presents for an annual physical exam.  She is currently on dual antiplatelet therapy following a previous hemorrhage. There is a family history of hemorrhage, specifically in her aunt, but no known genetic component has been identified in her case.  She maintains good blood pressure control and her weight has remained stable since the hemorrhage. She is active, participating in bowling three times a week.  She has previously undergone a colonoscopy.  She is currently taking Brilinta , which she recently refilled.         OBJECTIVE:       07/18/2024    8:16 AM  Depression screen PHQ 2/9  Decreased Interest 0  Down, Depressed, Hopeless 0  PHQ - 2 Score 0     BP Readings from Last 3 Encounters:  07/18/24 116/80  03/18/24 103/66  11/24/23 110/72    BP 116/80   Pulse 79   Ht 5' 8 (1.727 m)   Wt 191 lb 6.4 oz (86.8 kg)   LMP 03/06/2018   SpO2 97%   BMI 29.10 kg/m    Physical Exam          Physical Exam  ASSESSMENT/PLAN:   Assessment & Plan Hyperlipidemia, unspecified hyperlipidemia type  Aneurysm of left internal carotid artery  Annual physical exam  Encounter for screening colonoscopy  Mixed hyperlipidemia    Assessment and Plan    Annual physical examination and preventive care Overall healthy with well-controlled blood pressure and stable weight. Engages in regular physical activity. Discussed strength training benefits. Recommended pneumococcal vaccine due to increased pneumonia risk in hospital settings. - Ordered blood work  including electrolytes, kidney function, liver function, and cholesterol. - Administered pneumococcal vaccine. - Encouraged strength training twice a week.  Hyperlipidemia Currently on cholesterol medication with no refills remaining. - Refilled cholesterol medication.  Cerebral aneurysm, nonruptured On dual antiplatelet therapy. No genetic component identified. Family history in aunt. Blood pressure well-controlled. - Continue dual antiplatelet therapy.  Colorectal cancer screening Due for screening. Prefers Cologuard. Informed that positive result requires colonoscopy. - Ordered Cologuard test.         Nicloe Frontera A. Vita MD [facility] "

## 2024-07-18 NOTE — Addendum Note (Signed)
 Addended by: LATTIE CARLO BROCKS on: 07/18/2024 09:14 AM   Modules accepted: Orders

## 2024-07-19 ENCOUNTER — Ambulatory Visit: Payer: Self-pay | Admitting: Family Medicine

## 2024-07-26 ENCOUNTER — Encounter: Payer: BC Managed Care – PPO | Admitting: Family Medicine

## 2025-07-19 ENCOUNTER — Ambulatory Visit: Admitting: Family Medicine
